# Patient Record
Sex: Female | Born: 1995 | Race: Black or African American | Hispanic: No | Marital: Single | Smoking: Never smoker
Health system: Southern US, Community
[De-identification: ages and names within clinical notes are randomized; demographics above are authoritative.]

## PROBLEM LIST (undated history)

## (undated) DIAGNOSIS — J302 Other seasonal allergic rhinitis: Secondary | ICD-10-CM

## (undated) DIAGNOSIS — F419 Anxiety disorder, unspecified: Secondary | ICD-10-CM

## (undated) DIAGNOSIS — F329 Major depressive disorder, single episode, unspecified: Secondary | ICD-10-CM

## (undated) DIAGNOSIS — F32A Depression, unspecified: Secondary | ICD-10-CM

## (undated) DIAGNOSIS — J45909 Unspecified asthma, uncomplicated: Secondary | ICD-10-CM

---
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
## 2017-01-04 ENCOUNTER — Ambulatory Visit (INDEPENDENT_AMBULATORY_CARE_PROVIDER_SITE_OTHER): Payer: BLUE CROSS/BLUE SHIELD

## 2017-01-04 DIAGNOSIS — J309 Allergic rhinitis, unspecified: Secondary | ICD-10-CM

## 2017-01-04 NOTE — Progress Notes (Signed)
Immunotherapy   Patient Details  Name: Mary Soto MRN: [id number] Date of Birth: 01/02/1996  01/04/2017  Mary Soto here to pick up  blue vial (weeds-d-mite) (grass-tree) Following schedule: A  Frequency:2 times per week Epi-Pen:Epi-Pen Available  Consent signed and patient instructions given. [facility]. Previous records sent to scan center. No problems after 30 minutes in office.    Mary Soto 01/04/2017, 3:54 PM

## 2017-03-26 ENCOUNTER — Ambulatory Visit (INDEPENDENT_AMBULATORY_CARE_PROVIDER_SITE_OTHER): Payer: BLUE CROSS/BLUE SHIELD

## 2017-03-26 DIAGNOSIS — H1010 Acute atopic conjunctivitis, unspecified eye: Secondary | ICD-10-CM

## 2017-03-26 DIAGNOSIS — J309 Allergic rhinitis, unspecified: Secondary | ICD-10-CM | POA: Diagnosis not present

## 2017-04-13 ENCOUNTER — Ambulatory Visit (INDEPENDENT_AMBULATORY_CARE_PROVIDER_SITE_OTHER): Payer: BLUE CROSS/BLUE SHIELD

## 2017-04-13 DIAGNOSIS — J309 Allergic rhinitis, unspecified: Secondary | ICD-10-CM | POA: Diagnosis not present

## 2017-04-17 ENCOUNTER — Ambulatory Visit (INDEPENDENT_AMBULATORY_CARE_PROVIDER_SITE_OTHER): Payer: BLUE CROSS/BLUE SHIELD | Admitting: *Deleted

## 2017-04-17 DIAGNOSIS — J309 Allergic rhinitis, unspecified: Secondary | ICD-10-CM | POA: Diagnosis not present

## 2017-04-27 ENCOUNTER — Ambulatory Visit (INDEPENDENT_AMBULATORY_CARE_PROVIDER_SITE_OTHER): Payer: BLUE CROSS/BLUE SHIELD

## 2017-04-27 DIAGNOSIS — J309 Allergic rhinitis, unspecified: Secondary | ICD-10-CM | POA: Diagnosis not present

## 2017-05-31 ENCOUNTER — Ambulatory Visit (INDEPENDENT_AMBULATORY_CARE_PROVIDER_SITE_OTHER): Payer: BLUE CROSS/BLUE SHIELD | Admitting: *Deleted

## 2017-05-31 DIAGNOSIS — J309 Allergic rhinitis, unspecified: Secondary | ICD-10-CM

## 2017-06-18 ENCOUNTER — Ambulatory Visit (INDEPENDENT_AMBULATORY_CARE_PROVIDER_SITE_OTHER): Payer: BLUE CROSS/BLUE SHIELD

## 2017-06-18 DIAGNOSIS — J309 Allergic rhinitis, unspecified: Secondary | ICD-10-CM

## 2017-06-29 ENCOUNTER — Ambulatory Visit (INDEPENDENT_AMBULATORY_CARE_PROVIDER_SITE_OTHER): Payer: BLUE CROSS/BLUE SHIELD

## 2017-06-29 DIAGNOSIS — J309 Allergic rhinitis, unspecified: Secondary | ICD-10-CM | POA: Diagnosis not present

## 2017-07-19 ENCOUNTER — Ambulatory Visit (INDEPENDENT_AMBULATORY_CARE_PROVIDER_SITE_OTHER): Payer: BLUE CROSS/BLUE SHIELD | Admitting: *Deleted

## 2017-07-19 DIAGNOSIS — J309 Allergic rhinitis, unspecified: Secondary | ICD-10-CM | POA: Diagnosis not present

## 2017-07-31 ENCOUNTER — Ambulatory Visit (INDEPENDENT_AMBULATORY_CARE_PROVIDER_SITE_OTHER): Payer: BLUE CROSS/BLUE SHIELD

## 2017-07-31 DIAGNOSIS — J309 Allergic rhinitis, unspecified: Secondary | ICD-10-CM | POA: Diagnosis not present

## 2017-08-09 ENCOUNTER — Ambulatory Visit (INDEPENDENT_AMBULATORY_CARE_PROVIDER_SITE_OTHER): Payer: BLUE CROSS/BLUE SHIELD

## 2017-08-09 DIAGNOSIS — J309 Allergic rhinitis, unspecified: Secondary | ICD-10-CM

## 2017-08-14 ENCOUNTER — Ambulatory Visit (INDEPENDENT_AMBULATORY_CARE_PROVIDER_SITE_OTHER): Payer: BLUE CROSS/BLUE SHIELD | Admitting: *Deleted

## 2017-08-14 DIAGNOSIS — J309 Allergic rhinitis, unspecified: Secondary | ICD-10-CM | POA: Diagnosis not present

## 2017-08-28 ENCOUNTER — Ambulatory Visit (INDEPENDENT_AMBULATORY_CARE_PROVIDER_SITE_OTHER): Payer: BLUE CROSS/BLUE SHIELD | Admitting: *Deleted

## 2017-08-28 DIAGNOSIS — J309 Allergic rhinitis, unspecified: Secondary | ICD-10-CM | POA: Diagnosis not present

## 2017-10-15 ENCOUNTER — Ambulatory Visit (INDEPENDENT_AMBULATORY_CARE_PROVIDER_SITE_OTHER): Payer: BLUE CROSS/BLUE SHIELD | Admitting: *Deleted

## 2017-10-15 DIAGNOSIS — J309 Allergic rhinitis, unspecified: Secondary | ICD-10-CM

## 2017-10-31 ENCOUNTER — Ambulatory Visit (INDEPENDENT_AMBULATORY_CARE_PROVIDER_SITE_OTHER): Payer: BLUE CROSS/BLUE SHIELD

## 2017-10-31 DIAGNOSIS — J309 Allergic rhinitis, unspecified: Secondary | ICD-10-CM | POA: Diagnosis not present

## 2017-11-08 ENCOUNTER — Ambulatory Visit (INDEPENDENT_AMBULATORY_CARE_PROVIDER_SITE_OTHER): Payer: BLUE CROSS/BLUE SHIELD | Admitting: *Deleted

## 2017-11-08 DIAGNOSIS — J309 Allergic rhinitis, unspecified: Secondary | ICD-10-CM

## 2017-11-16 ENCOUNTER — Ambulatory Visit (INDEPENDENT_AMBULATORY_CARE_PROVIDER_SITE_OTHER): Payer: BLUE CROSS/BLUE SHIELD

## 2017-11-16 DIAGNOSIS — J309 Allergic rhinitis, unspecified: Secondary | ICD-10-CM

## 2017-11-20 ENCOUNTER — Ambulatory Visit (INDEPENDENT_AMBULATORY_CARE_PROVIDER_SITE_OTHER): Payer: BLUE CROSS/BLUE SHIELD | Admitting: *Deleted

## 2017-11-20 ENCOUNTER — Ambulatory Visit: Payer: BLUE CROSS/BLUE SHIELD | Admitting: *Deleted

## 2017-11-20 DIAGNOSIS — J309 Allergic rhinitis, unspecified: Secondary | ICD-10-CM | POA: Diagnosis not present

## 2017-12-04 ENCOUNTER — Ambulatory Visit (INDEPENDENT_AMBULATORY_CARE_PROVIDER_SITE_OTHER): Payer: BLUE CROSS/BLUE SHIELD | Admitting: *Deleted

## 2017-12-04 DIAGNOSIS — J309 Allergic rhinitis, unspecified: Secondary | ICD-10-CM | POA: Diagnosis not present

## 2017-12-11 ENCOUNTER — Ambulatory Visit (INDEPENDENT_AMBULATORY_CARE_PROVIDER_SITE_OTHER): Payer: BLUE CROSS/BLUE SHIELD | Admitting: *Deleted

## 2017-12-11 DIAGNOSIS — J309 Allergic rhinitis, unspecified: Secondary | ICD-10-CM

## 2017-12-18 ENCOUNTER — Ambulatory Visit (INDEPENDENT_AMBULATORY_CARE_PROVIDER_SITE_OTHER): Payer: BLUE CROSS/BLUE SHIELD | Admitting: *Deleted

## 2017-12-18 DIAGNOSIS — J309 Allergic rhinitis, unspecified: Secondary | ICD-10-CM | POA: Diagnosis not present

## 2019-07-28 ENCOUNTER — Other Ambulatory Visit: Payer: Self-pay

## 2019-07-28 ENCOUNTER — Ambulatory Visit (INDEPENDENT_AMBULATORY_CARE_PROVIDER_SITE_OTHER): Payer: BC Managed Care – PPO | Admitting: Podiatry

## 2019-07-28 ENCOUNTER — Ambulatory Visit (INDEPENDENT_AMBULATORY_CARE_PROVIDER_SITE_OTHER): Payer: BC Managed Care – PPO

## 2019-07-28 DIAGNOSIS — M2142 Flat foot [pes planus] (acquired), left foot: Secondary | ICD-10-CM

## 2019-07-28 DIAGNOSIS — M2141 Flat foot [pes planus] (acquired), right foot: Secondary | ICD-10-CM

## 2019-07-28 DIAGNOSIS — M778 Other enthesopathies, not elsewhere classified: Secondary | ICD-10-CM

## 2019-07-28 DIAGNOSIS — M779 Enthesopathy, unspecified: Secondary | ICD-10-CM

## 2019-07-30 NOTE — Progress Notes (Signed)
   Subjective:  23 y.o. female presenting today as a new patient with a chief complaint of pain located on the plantar aspects of bilateral feet that has been present for the past few years. She states the pain in her feet is now affecting her lower back. Excessive walking increases the pain. She has had physical therapy and used crutches in the past for treatment. Patient is here for further evaluation and treatment.   Past Medical History:  Diagnosis Date  . Anxiety and depression   . Asthma   . Seasonal allergies        Objective/Physical Exam General: The patient is alert and oriented x3 in no acute distress.  Dermatology: Skin is warm, dry and supple bilateral lower extremities. Negative for open lesions or macerations.  Vascular: Palpable pedal pulses bilaterally. No edema or erythema noted. Capillary refill within normal limits.  Neurological: Epicritic and protective threshold grossly intact bilaterally.   Musculoskeletal Exam: Range of motion within normal limits to all pedal and ankle joints bilateral. Muscle strength 5/5 in all groups bilateral.  Upon weightbearing there is a medial longitudinal arch collapse bilaterally. Remove foot valgus noted to the bilateral lower extremities with excessive pronation upon mid stance. Pain with palpation noted to the bilateral midfoot.   Radiographic Exam:  Normal osseous mineralization. Joint spaces preserved. No fracture/dislocation/boney destruction.   Pes planus noted on radiographic exam lateral views. Decreased calcaneal inclination and metatarsal declination angle is noted. Anterior break in the cyma line noted on lateral views. Medial talar head to deviation noted on AP radiograph.   Assessment: 1. pes planus bilateral 2. Capsulitis bilateral midfoot   Plan of Care:  1. Patient was evaluated. X-Rays reviewed.  2. Appointment with Liliane Channel, Pedorthist, for custom molded orthotics.  3. Recommended good shoe gear.  4. Return to  clinic as needed.    Edrick Kins, DPM [facility]  Dr. Edrick Kins, [address]                                        [place], [place] [address]                Office [phone]  Fax [phone]

## 2019-08-06 ENCOUNTER — Ambulatory Visit (INDEPENDENT_AMBULATORY_CARE_PROVIDER_SITE_OTHER): Payer: BC Managed Care – PPO | Admitting: Orthotics

## 2019-08-06 ENCOUNTER — Other Ambulatory Visit: Payer: Self-pay

## 2019-08-06 DIAGNOSIS — M2141 Flat foot [pes planus] (acquired), right foot: Secondary | ICD-10-CM

## 2019-08-06 DIAGNOSIS — M778 Other enthesopathies, not elsewhere classified: Secondary | ICD-10-CM

## 2019-08-06 DIAGNOSIS — M2142 Flat foot [pes planus] (acquired), left foot: Secondary | ICD-10-CM

## 2019-08-06 DIAGNOSIS — M779 Enthesopathy, unspecified: Secondary | ICD-10-CM

## 2019-08-06 NOTE — Progress Notes (Signed)
Patient is being seen today for f/o to address congential pes planus/pes planovalgus. Patient is active youth and demonstrates over pronation in gait, prominent medially shifted talus, and collapse of medial column.  Goals are RF stability, longitudinal arch support, decrease in pronation, and ease of discomfort in mobility related activities.   

## 2019-09-02 ENCOUNTER — Ambulatory Visit: Payer: BC Managed Care – PPO | Admitting: Orthotics

## 2019-09-02 ENCOUNTER — Other Ambulatory Visit: Payer: Self-pay

## 2019-09-02 DIAGNOSIS — M778 Other enthesopathies, not elsewhere classified: Secondary | ICD-10-CM

## 2019-09-02 DIAGNOSIS — M2141 Flat foot [pes planus] (acquired), right foot: Secondary | ICD-10-CM

## 2019-09-02 DIAGNOSIS — M2142 Flat foot [pes planus] (acquired), left foot: Secondary | ICD-10-CM

## 2019-09-02 NOTE — Progress Notes (Signed)
Patient came in today to pick up custom made foot orthotics.  The goals were accomplished and the patient reported no dissatisfaction with said orthotics.  Patient was advised of breakin period and how to report any issues. 

## 2019-10-23 ENCOUNTER — Telehealth: Payer: Self-pay | Admitting: Podiatry

## 2019-10-23 DIAGNOSIS — M2141 Flat foot [pes planus] (acquired), right foot: Secondary | ICD-10-CM | POA: Diagnosis not present

## 2019-10-23 DIAGNOSIS — M2142 Flat foot [pes planus] (acquired), left foot: Secondary | ICD-10-CM

## 2019-10-23 DIAGNOSIS — M779 Enthesopathy, unspecified: Secondary | ICD-10-CM | POA: Diagnosis not present

## 2019-10-23 NOTE — Telephone Encounter (Signed)
pts mom called and is wanting to go ahead and order another pair of orthotics for this yr. She would like to get the shorter orthotics not the full length.

## 2021-02-22 NOTE — Progress Notes (Signed)
New Patient Note  RE: Mary Soto MRN: [id number] DOB: December 22, 1995 Date of Office Visit: 02/23/2021  Consult requested by: Mary Sites, MD Primary care provider: Lewis Moccasin, MD  Chief Complaint: Allergy Testing, Nasal Congestion, and Asthma  History of Present Illness: I had the pleasure of seeing Mary Soto for initial evaluation at the [facility] on 02/23/2021. She is a 25 y.o. female, who is self-referred here for the evaluation of allergies.  Patient was last seen by Mary Soto in our office in 2018 for allergic rhino conjunctivitis (was on AIT with weed-dmite & grass-tree) and asthma.   Rhinitis:  She reports symptoms of nasal congestion, rhinorrhea, mucous, coughing, itchy/irritated eyes. Symptoms have been going on for 20+ years. The symptoms are present all year around with worsening in spring. Other triggers include exposure to pollen, dust mites, mold, pet dander. Anosmia: no. Headache: no. She has used allegra, zyrtec, Nasonex with some improvement in symptoms. The nasal sprays tend to cause epistaxis. Sinus infections: no. Previous work up includes: not recently. Patient was on allergy injections for about 4-5 years with some benefit but noticed worsening symptoms since off injections. Tolerated injections with no issues.   Previous ENT evaluation: no. Previous sinus imaging: no. History of nasal polyps: no. Last eye exam: within the past year. History of reflux: yes but improved with change of diet.   Asthma:  ACT score 20.  She reports symptoms of chest tightness, shortness of breath, coughing, wheezing for 15+ years. Current medications include none. She reports not using aerochamber with inhalers. She tried the following inhalers: albuterol, Qvar. Main triggers are allergies,and exercise, pet. In the last month, frequency of symptoms: increased since off antihistamines. Frequency of nocturnal symptoms: 0x/month. Frequency of SABA use:  0x/week. Interference with physical activity: rarely. In the last 12 months, emergency room visits/urgent care visits/doctor office visits or hospitalizations due to respiratory issues: 0. In the last 12 months, oral steroids courses: 0. Lifetime history of hospitalization for respiratory issues: 0. Prior intubations: 0. History of pneumonia: no. Smoking exposure: no. Up to date with flu vaccine: yes. Up to date with COVID-19 vaccine: yes. Prior Covid-19 infection: no.  Patient is an avid runner.  Assessment and Plan: Mary Soto is a 26 y.o. female with: Not well controlled asthma without complication Diagnosed with asthma over 15 years ago.  Triggers include allergies, exertion and pet dander.  Having daily symptoms since off antihistamines.  No recent inhaler use.  Patient is an avid runner.  Act score 20.  Today's spirometry showed severe obstruction with 67% improvement in FEV1 post bronchodilator treatment.  Patient seems to be under perceiver of her symptoms. . Daily controller medication(s): START Airduo Digihaler 232 mcg 1 puff twice a day and rinse mouth after each use. o Sample given. Coupon given. Demonstrated proper use.  . Prior to physical activity: May use albuterol rescue inhaler 2 puffs 5 to 15 minutes prior to strenuous physical activities. Marland Kitchen Rescue medications: May use albuterol rescue inhaler 2 puffs or nebulizer every 4 to 6 hours as needed for shortness of breath, chest tightness, coughing, and wheezing. Monitor frequency of use.   Repeat spirometry at next visit.   Other allergic rhinitis Perennial rhinoconjunctivitis symptoms for 20+ years with worsening in the spring.  Used to be on allergy immunotherapy (weed-dmite & grass-tree) for 4 to 5 years with some benefit but noticed worsening symptoms since off injections.  Unable to skin test today due to uncontrolled asthma -  will get bloodwork instead.   Start environmental control measures as below for pollen avoidance.    Take allegra 180mg  in the morning and take Xyzal 5mg  in the evening. Start dymista (fluticasone + azelastine nasal spray combination) 1 spray per nostril twice a day. If it's not covered let know.   Nasal saline spray (i.e., Simply Saline) or nasal saline lavage (i.e., NeilMed) is recommended as needed and prior to medicated nasal sprays.  May use olopatadine eye drops 0.2% once a day as needed for itchy/watery eyes.  Recommend re-starting allergy immunotherapy once asthma is more stable.   Allergic conjunctivitis of both eyes  See assessment and plan as above for allergic rhinitis.  Adverse reaction to food, subsequent encounter Corn causes itching in the back only and bloating. Trying to limit corn ingestion.  Get bloodwork.  Limit corn intake.   Return in about 4 weeks (around 03/23/2021).  Meds ordered this encounter  Medications  . levocetirizine (XYZAL) 5 MG tablet    Sig: Take 1 tablet (5 mg total) by mouth every evening.    Dispense:  30 tablet    Refill:  5  . Azelastine-Fluticasone 137-50 MCG/ACT SUSP    Sig: Place 1 spray into the nose in the morning and at bedtime.    Dispense:  23 g    Refill:  5  . Olopatadine HCl 0.2 % SOLN    Sig: Apply 1 drop to eye daily as needed (itchy/watery eyes).    Dispense:  2.5 mL    Refill:  5  . PROAIR DIGIHALER 108 (90 Base) MCG/ACT AEPB    Sig: Inhale 2 puffs into the lungs every 4 (four) hours as needed.    Dispense:  1 each    Refill:  1    Please use attached coupon BIN [id number],GRP [place], ID 05/23/2021.  [id number] AIRDUO DIGIHALER 232-14 MCG/ACT AEPB    Sig: Inhale 1 puff into the lungs in the morning and at bedtime. Rinse mouth after each use.    Dispense:  1 each    Refill:  1    Please use attached coupon BIN [id number], GRP [id number], ID Marland Kitchen.    Lab Orders     CBC with Differential/Platelet     Allergens w/Total IgE Area 2     Corn IgE  Other allergy screening: Food allergy: yes  Corn - back itching and  bloating.  Medication allergy: no Hymenoptera allergy: no Urticaria: in the past as a child from pollen exposure.  Eczema:no History of recurrent infections suggestive of immunodeficency: no  Diagnostics: Spirometry:  Tracings reviewed. Her effort: It was hard to get consistent efforts and there is a question as to whether this reflects a maximal maneuver. FVC: 3.47L FEV1: 1.18L, 40% predicted FEV1/FVC ratio: 34% Interpretation: Spirometry consistent with severe obstructive disease with 67% improvement in FEV1 post bronchodilator treatment and clinically feeling improved.  Please see scanned spirometry results for details.  Past Medical History: Patient Active Problem List   Diagnosis Date Noted  . Allergic conjunctivitis of both eyes 02/23/2021  . Adverse reaction to food, subsequent encounter 02/23/2021  . Not well controlled asthma without complication 07/24/2015  . Other allergic rhinitis 07/24/2015   Past Medical History:  Diagnosis Date  . Anxiety and depression   . Asthma   . Seasonal allergies    Past Surgical History: Past Surgical History:  Procedure Laterality Date  . WISDOM TOOTH EXTRACTION  10/2016   Medication List:  Current Outpatient Medications  Medication  Sig Dispense Refill  . AIRDUO DIGIHALER 232-14 MCG/ACT AEPB Inhale 1 puff into the lungs in the morning and at bedtime. Rinse mouth after each use. 1 each 1  . Azelastine-Fluticasone 137-50 MCG/ACT SUSP Place 1 spray into the nose in the morning and at bedtime. 23 g 5  . fexofenadine (ALLEGRA) 180 MG tablet Take 180 mg by mouth at bedtime.    Marland Kitchen ibuprofen (ADVIL,MOTRIN) 200 MG tablet Take 400 mg by mouth every 6 (six) hours as needed for pain.    Marland Kitchen levocetirizine (XYZAL) 5 MG tablet Take 1 tablet (5 mg total) by mouth every evening. 30 tablet 5  . montelukast (SINGULAIR) 10 MG tablet Take 10 mg by mouth daily.    . Olopatadine HCl 0.2 % SOLN Apply 1 drop to eye daily as needed (itchy/watery eyes). 2.5 mL  5  . PROAIR DIGIHALER 108 (90 Base) MCG/ACT AEPB Inhale 2 puffs into the lungs every 4 (four) hours as needed. 1 each 1  . Melatonin 1 MG TABS Take 1 mg by mouth at bedtime. (Patient not taking: Reported on 02/23/2021)     No current facility-administered medications for this visit.   Allergies: Allergies  Allergen Reactions  . Corn-Containing Products Itching   Social History: Social History   Socioeconomic History  . Marital status: Single    Spouse name: Not on file  . Number of children: Not on file  . Years of education: Not on file  . Highest education level: Not on file  Occupational History  . Not on file  Tobacco Use  . Smoking status: Never Smoker  . Smokeless tobacco: Never Used  Vaping Use  . Vaping Use: Never used  Substance and Sexual Activity  . Alcohol use: No  . Drug use: No  . Sexual activity: Not on file  Other Topics Concern  . Not on file  Social History Narrative  . Not on file   Social Determinants of Health   Financial Resource Strain: Not on file  Food Insecurity: Not on file  Transportation Needs: Not on file  Physical Activity: Not on file  Stress: Not on file  Social Connections: Not on file   Lives in a 23 year old home. Smoking: denies Occupation: Media planner HistorySurveyor, minerals in the house: no Engineer, civil (consulting) in the family room: no Carpet in the bedroom: no Heating: electric Cooling: central Pet: yes 3 dogs  Family History: Family History  Problem Relation Age of Onset  . Eczema Mother   . Allergic rhinitis Mother   . Eczema Brother   . Allergic rhinitis Brother   . Asthma Brother   . Angioedema Neg Hx   . Immunodeficiency Neg Hx   . Urticaria Neg Hx    Review of Systems  Constitutional: Negative for appetite change, chills, fever and unexpected weight change.  HENT: Positive for congestion, rhinorrhea and sneezing.   Eyes: Positive for discharge and itching.  Respiratory: Positive for cough, chest  tightness, shortness of breath and wheezing.   Cardiovascular: Negative for chest pain.  Gastrointestinal: Negative for abdominal pain.  Genitourinary: Negative for difficulty urinating.  Skin: Negative for rash.  Allergic/Immunologic: Positive for environmental allergies.  Neurological: Negative for headaches.   Objective: BP 108/72 (BP Location: Right Arm, Patient Position: Sitting, Cuff Size: Normal)   Pulse 91   Temp (!) 97 F (36.1 C) (Temporal)   Resp 18   Ht 5\' 5"  (1.651 m)   Wt 144 lb (65.3 kg)   SpO2  97%   BMI 23.96 kg/m  Body mass index is 23.96 kg/m. Physical Exam Vitals and nursing note reviewed.  Constitutional:      Appearance: Normal appearance. She is well-developed.  HENT:     Head: Normocephalic and atraumatic.     Right Ear: Tympanic membrane and external ear normal.     Left Ear: Tympanic membrane and external ear normal.     Nose: Nose normal.     Mouth/Throat:     Mouth: Mucous membranes are moist.     Pharynx: Oropharynx is clear.  Eyes:     Conjunctiva/sclera: Conjunctivae normal.  Cardiovascular:     Rate and Rhythm: Normal rate and regular rhythm.     Heart sounds: Normal heart sounds. No murmur heard. No friction rub. No gallop.   Pulmonary:     Effort: Pulmonary effort is normal.     Breath sounds: Normal breath sounds. No wheezing, rhonchi or rales.  Musculoskeletal:     Cervical back: Neck supple.  Skin:    General: Skin is warm.     Findings: No rash.  Neurological:     Mental Status: She is alert and oriented to person, place, and time.  Psychiatric:        Behavior: Behavior normal.    The plan was reviewed with the patient/family, and all questions/concerned were addressed.  It was my pleasure to see Mary Soto today and participate in her care. Please feel free to contact me with any questions or concerns.  Sincerely,  Wyline MoodYoon Val Schiavo, DO Allergy & Immunology  [facility] [place] office:  [phone] [facility] office: [phone]

## 2021-02-23 ENCOUNTER — Ambulatory Visit: Payer: BC Managed Care – PPO | Admitting: Allergy

## 2021-02-23 ENCOUNTER — Encounter: Payer: Self-pay | Admitting: Allergy

## 2021-02-23 ENCOUNTER — Other Ambulatory Visit: Payer: Self-pay

## 2021-02-23 ENCOUNTER — Other Ambulatory Visit: Payer: Self-pay | Admitting: Allergy

## 2021-02-23 VITALS — BP 108/72 | HR 91 | Temp 97.0°F | Resp 18 | Ht 65.0 in | Wt 144.0 lb

## 2021-02-23 DIAGNOSIS — J45909 Unspecified asthma, uncomplicated: Secondary | ICD-10-CM

## 2021-02-23 DIAGNOSIS — H1013 Acute atopic conjunctivitis, bilateral: Secondary | ICD-10-CM | POA: Insufficient documentation

## 2021-02-23 DIAGNOSIS — J3089 Other allergic rhinitis: Secondary | ICD-10-CM | POA: Diagnosis not present

## 2021-02-23 DIAGNOSIS — T781XXD Other adverse food reactions, not elsewhere classified, subsequent encounter: Secondary | ICD-10-CM | POA: Diagnosis not present

## 2021-02-23 MED ORDER — AZELASTINE-FLUTICASONE 137-50 MCG/ACT NA SUSP: 1.0000 | Freq: Two times a day (BID) | NASAL | 5 refills | Status: DC

## 2021-02-23 MED ORDER — LEVOCETIRIZINE DIHYDROCHLORIDE 5 MG PO TABS: 5.0000 mg | ORAL_TABLET | Freq: Every evening | ORAL | 5 refills | Status: DC

## 2021-02-23 MED ORDER — AIRDUO DIGIHALER 232-14 MCG/ACT IN AEPB: 1.0000 | INHALATION_SPRAY | Freq: Two times a day (BID) | RESPIRATORY_TRACT | 1 refills | Status: DC

## 2021-02-23 MED ORDER — OLOPATADINE HCL 0.2 % OP SOLN: 1.0000 [drp] | Freq: Every day | OPHTHALMIC | 5 refills | Status: AC | PRN

## 2021-02-23 MED ORDER — PROAIR DIGIHALER 108 (90 BASE) MCG/ACT IN AEPB: 2.0000 | INHALATION_SPRAY | RESPIRATORY_TRACT | 1 refills | Status: DC | PRN

## 2021-02-23 NOTE — Assessment & Plan Note (Signed)
Perennial rhinoconjunctivitis symptoms for 20+ years with worsening in the spring.  Used to be on allergy immunotherapy (weed-dmite & grass-tree) for 4 to 5 years with some benefit but noticed worsening symptoms since off injections.  Unable to skin test today due to uncontrolled asthma - will get bloodwork instead.   Start environmental control measures as below for pollen avoidance.   Take allegra 180mg  in the morning and take Xyzal 5mg  in the evening. Start dymista (fluticasone + azelastine nasal spray combination) 1 spray per nostril twice a day. If it's not covered let know.   Nasal saline spray (i.e., Simply Saline) or nasal saline lavage (i.e., NeilMed) is recommended as needed and prior to medicated nasal sprays.  May use olopatadine eye drops 0.2% once a day as needed for itchy/watery eyes.  Recommend re-starting allergy immunotherapy once asthma is more stable.

## 2021-02-23 NOTE — Assessment & Plan Note (Signed)
   See assessment and plan as above for allergic rhinitis.  

## 2021-02-23 NOTE — Patient Instructions (Addendum)
.   Get bloodwork:  o We are ordering labs, so please allow 1-2 weeks for the results to come back. o With the newly implemented Cures Act, the labs might be visible to you at the same time that they become visible to me. However, I will not address the results until all of the results are back, so please be patient.  o In the meantime, continue recommendations in your patient instructions, including avoidance measures (if applicable), until you hear from me.  Asthma:  . Daily controller medication(s): START Airduo Digihaler 232 mcg 1 puff twice a day and rinse mouth after each use. o Sample given. Coupon given. Demonstrated proper use.  . Prior to physical activity: May use albuterol rescue inhaler 2 puffs 5 to 15 minutes prior to strenuous physical activities. Marland Kitchen Rescue medications: May use albuterol rescue inhaler 2 puffs or nebulizer every 4 to 6 hours as needed for shortness of breath, chest tightness, coughing, and wheezing. Monitor frequency of use.  . Asthma control goals:  o Full participation in all desired activities (may need albuterol before activity) o Albuterol use two times or less a week on average (not counting use with activity) o Cough interfering with sleep two times or less a month o Oral steroids no more than once a year o No hospitalizations  Environmental allergies  Start environmental control measures as below.  Take allegra 180mg  in the morning and take Xyzal 5mg  in the evening. Start dymista (fluticasone + azelastine nasal spray combination) 1 spray per nostril twice a day. If it's not covered let know.   Nasal saline spray (i.e., Simply Saline) or nasal saline lavage (i.e., NeilMed) is recommended as needed and prior to medicated nasal sprays.  May use olopatadine eye drops 0.2% once a day as needed for itchy/watery eyes.  Follow up in 1 months or sooner if needed.   Reducing Pollen Exposure . Pollen seasons: trees (spring), grass (summer) and ragweed/weeds  (fall). 11-18-1982 Keep windows closed in your home and car to lower pollen exposure.  03-22-1981 air conditioning in the bedroom and throughout the house if possible.  . Avoid going out in dry windy days - especially early morning. . Pollen counts are highest between 5 - 10 AM and on dry, hot and windy days.  . Save outside activities for late afternoon or after a heavy rain, when pollen levels are lower.  . Avoid mowing of grass if you have grass pollen allergy. Marland Kitchen Be aware that pollen can also be transported indoors on people and pets.  . Dry your clothes in an automatic dryer rather than hanging them outside where they might collect pollen.  . Rinse hair and eyes before bedtime.

## 2021-02-23 NOTE — Assessment & Plan Note (Signed)
Corn causes itching in the back only and bloating. Trying to limit corn ingestion.  Get bloodwork.  Limit corn intake.

## 2021-02-23 NOTE — Assessment & Plan Note (Signed)
Diagnosed with asthma over 15 years ago.  Triggers include allergies, exertion and pet dander.  Having daily symptoms since off antihistamines.  No recent inhaler use.  Patient is an avid runner.  Act score 20.  Today's spirometry showed severe obstruction with 67% improvement in FEV1 post bronchodilator treatment.  Patient seems to be under perceiver of her symptoms. . Daily controller medication(s): START Airduo Digihaler 232 mcg 1 puff twice a day and rinse mouth after each use. o Sample given. Coupon given. Demonstrated proper use.  . Prior to physical activity: May use albuterol rescue inhaler 2 puffs 5 to 15 minutes prior to strenuous physical activities. Marland Kitchen Rescue medications: May use albuterol rescue inhaler 2 puffs or nebulizer every 4 to 6 hours as needed for shortness of breath, chest tightness, coughing, and wheezing. Monitor frequency of use.   Repeat spirometry at next visit.

## 2021-02-28 LAB — ALLERGENS W/TOTAL IGE AREA 2
Alternaria Alternata IgE: 0.13 kU/L — AB
Aspergillus Fumigatus IgE: <0.1 kU/L
Bermuda Grass IgE: 4.92 kU/L — AB
Cat Dander IgE: 0.33 kU/L — AB
Cedar, Mountain IgE: 1.16 kU/L — AB
Cladosporium Herbarum IgE: <0.1 kU/L
Cockroach, German IgE: <0.1 kU/L
Common Silver Birch IgE: 13.2 kU/L — AB
Cottonwood IgE: 1.39 kU/L — AB
D Farinae IgE: <0.1 kU/L
D Pteronyssinus IgE: <0.1 kU/L
Dog Dander IgE: 0.15 kU/L — AB
Elm, American IgE: 6.95 kU/L — AB
IgE (Immunoglobulin E), Serum: 214 IU/mL (ref 6–495)
Johnson Grass IgE: 8.53 kU/L — AB
Maple/Box Elder IgE: 2.9 kU/L — AB
Mouse Urine IgE: <0.1 kU/L
Oak, White IgE: 18.4 kU/L — AB
Pecan, Hickory IgE: 11 kU/L — AB
Penicillium Chrysogen IgE: <0.1 kU/L
Pigweed, Rough IgE: 2.08 kU/L — AB
Ragweed, Short IgE: 3.91 kU/L — AB
Sheep Sorrel IgE Qn: 4.22 kU/L — AB
Timothy Grass IgE: 32.2 kU/L — AB
White Mulberry IgE: 3.75 kU/L — AB

## 2021-02-28 LAB — CBC WITH DIFFERENTIAL/PLATELET
Basophils Absolute: 0.1 10*3/uL (ref 0.0–0.2)
Basos: 1 %
EOS (ABSOLUTE): 0.6 10*3/uL — ABNORMAL HIGH (ref 0.0–0.4)
Eos: 9 %
Hematocrit: 41.5 % (ref 34.0–46.6)
Hemoglobin: 13 g/dL (ref 11.1–15.9)
Immature Grans (Abs): 0 10*3/uL (ref 0.0–0.1)
Immature Granulocytes: 1 %
Lymphocytes Absolute: 1.4 10*3/uL (ref 0.7–3.1)
Lymphs: 21 %
MCH: 28.2 pg (ref 26.6–33.0)
MCHC: 31.3 g/dL — ABNORMAL LOW (ref 31.5–35.7)
MCV: 90 fL (ref 79–97)
Monocytes Absolute: 0.4 10*3/uL (ref 0.1–0.9)
Monocytes: 7 %
Neutrophils Absolute: 4 10*3/uL (ref 1.4–7.0)
Neutrophils: 61 %
Platelets: 271 10*3/uL (ref 150–450)
RBC: 4.61 x10E6/uL (ref 3.77–5.28)
RDW: 12.8 % (ref 11.7–15.4)
WBC: 6.5 10*3/uL (ref 3.4–10.8)

## 2021-03-01 LAB — ALLERGEN, CORN F8: Allergen Corn, IgE: 1.63 kU/L — AB

## 2021-03-04 ENCOUNTER — Telehealth: Payer: Self-pay | Admitting: *Deleted

## 2021-03-04 NOTE — Telephone Encounter (Signed)
-----   Message from Ellamae Sia, DO sent at 03/01/2021  8:25 AM EDT ----- Please call patient.   Bloodwork positive to corn - start to avoid corn completely. Environmental allergy panel was positive to cat, grass pollen, tree pollen, ragweed pollen, weed pollen.  Borderline to dog.  Continue all inhalers and allergy medications as discussed at the last visit.  Based on your blood work you would also qualify for biologics injections for asthma.  Keep follow up in May to discuss further.

## 2021-03-28 ENCOUNTER — Other Ambulatory Visit: Payer: Self-pay

## 2021-03-28 ENCOUNTER — Encounter: Payer: Self-pay | Admitting: Allergy

## 2021-03-28 ENCOUNTER — Ambulatory Visit: Payer: BC Managed Care – PPO | Admitting: Allergy

## 2021-03-28 VITALS — BP 100/68 | HR 74 | Temp 97.8°F | Resp 18 | Ht 65.0 in | Wt 146.8 lb

## 2021-03-28 DIAGNOSIS — J3089 Other allergic rhinitis: Secondary | ICD-10-CM

## 2021-03-28 DIAGNOSIS — H1013 Acute atopic conjunctivitis, bilateral: Secondary | ICD-10-CM

## 2021-03-28 DIAGNOSIS — H1010 Acute atopic conjunctivitis, unspecified eye: Secondary | ICD-10-CM | POA: Insufficient documentation

## 2021-03-28 DIAGNOSIS — J302 Other seasonal allergic rhinitis: Secondary | ICD-10-CM | POA: Diagnosis not present

## 2021-03-28 DIAGNOSIS — J45909 Unspecified asthma, uncomplicated: Secondary | ICD-10-CM

## 2021-03-28 DIAGNOSIS — T781XXD Other adverse food reactions, not elsewhere classified, subsequent encounter: Secondary | ICD-10-CM

## 2021-03-28 MED ORDER — SPIRIVA RESPIMAT 1.25 MCG/ACT IN AERS: 2.0000 | INHALATION_SPRAY | Freq: Every day | RESPIRATORY_TRACT | 5 refills | Status: DC

## 2021-03-28 NOTE — Assessment & Plan Note (Signed)
Past history - Perennial rhinoconjunctivitis symptoms for 20+ years with worsening in the spring.  Used to be on allergy immunotherapy (weed-dmite & grass-tree) for 4 to 5 years with some benefit but noticed worsening symptoms since off injections. Interim history - stable. 2022 bloodwork positive to cat, grass pollen, tree pollen, ragweed pollen, weed pollen. Borderline to dog.  Continue environmental control measures as below.  Xyzal (levocetirizine) 5mg  in the evening. Continue dymista (fluticasone + azelastine nasal spray combination) 1 spray per nostril twice a day.   Nasal saline spray (i.e., Simply Saline) or nasal saline lavage (i.e., NeilMed) is recommended as needed and prior to medicated nasal sprays.  May use olopatadine eye drops 0.2% once a day as needed for itchy/watery eyes.  Recommend re-starting allergy immunotherapy once asthma is more stable.

## 2021-03-28 NOTE — Assessment & Plan Note (Signed)
Past history - Corn causes itching in the back only and bloating.  Interim history  - 2022 bloodwork positive to corn IgE.  Continue to avoid corn.  For mild symptoms you can take over the counter antihistamines such as Benadryl and monitor symptoms closely. If symptoms worsen or if you have severe symptoms including breathing issues, throat closure, significant swelling, whole body hives, severe diarrhea and vomiting, lightheadedness then seek immediate medical care.

## 2021-03-28 NOTE — Assessment & Plan Note (Signed)
Past history - Diagnosed with asthma over 15 years ago.  Triggers include allergies, exertion and pet dander.  Patient is an avid runner. 2022 spirometry showed severe obstruction with 67% improvement in FEV1 post bronchodilator treatment.  Interim history - doing better with Airduo and premedication with albuterol. Still running quite frequently.   ACT score 22.  Today's spirometry showed: moderate obstructive disease with 75% improvement in FEV1 post bronchodilator treatment. Clinically feeling unchanged. Patient seems to be an underperceiver of her symptoms.  . Read about biologics for asthma.  Marland Kitchen START Spiriva 1.29mcg 2 puffs once a day. Sample given and demonstrated proper use.  . Daily controller medication(s): continue Airduo Digihaler 232 mcg 1 puff twice a day and rinse mouth after each use. . Prior to physical activity: May use albuterol rescue inhaler 2 puffs 5 to 15 minutes prior to strenuous physical activities. Marland Kitchen Rescue medications: May use albuterol rescue inhaler 2 puffs or nebulizer every 4 to 6 hours as needed for shortness of breath, chest tightness, coughing, and wheezing. Monitor frequency of use.  . Get spirometry at next visit.

## 2021-03-28 NOTE — Patient Instructions (Addendum)
Asthma:  . Read about biologics for asthma.  Marland Kitchen START Spiriva 1.20mcg 2 puffs once a day. Sample given and demonstrated proper use.  . Daily controller medication(s): continue Airduo Digihaler 232 mcg 1 puff twice a day and rinse mouth after each use. . Prior to physical activity: May use albuterol rescue inhaler 2 puffs 5 to 15 minutes prior to strenuous physical activities. Marland Kitchen Rescue medications: May use albuterol rescue inhaler 2 puffs or nebulizer every 4 to 6 hours as needed for shortness of breath, chest tightness, coughing, and wheezing. Monitor frequency of use.  . Asthma control goals:  o Full participation in all desired activities (may need albuterol before activity) o Albuterol use two times or less a week on average (not counting use with activity) o Cough interfering with sleep two times or less a month o Oral steroids no more than once a year o No hospitalizations  Environmental allergies  Bloodwork positive to cat, grass pollen, tree pollen, ragweed pollen, weed pollen. Borderline to dog.  Continue environmental control measures as below.  Xyzal (levocetirizine) 5mg  in the evening. Continue dymista (fluticasone + azelastine nasal spray combination) 1 spray per nostril twice a day. If it's not covered let know.   Nasal saline spray (i.e., Simply Saline) or nasal saline lavage (i.e., NeilMed) is recommended as needed and prior to medicated nasal sprays.  May use olopatadine eye drops 0.2% once a day as needed for itchy/watery eyes.  Food allergy  Continue to avoid corn.  Follow up in 6 weeks or sooner if needed.   Reducing Pollen Exposure . Pollen seasons: trees (spring), grass (summer) and ragweed/weeds (fall). 03-22-1981 Keep windows closed in your home and car to lower pollen exposure.  Marland Kitchen air conditioning in the bedroom and throughout the house if possible.  . Avoid going out in dry windy days - especially early morning. . Pollen counts are highest between 5 -  10 AM and on dry, hot and windy days.  . Save outside activities for late afternoon or after a heavy rain, when pollen levels are lower.  . Avoid mowing of grass if you have grass pollen allergy. Lilian Kapur Be aware that pollen can also be transported indoors on people and pets.  . Dry your clothes in an automatic dryer rather than hanging them outside where they might collect pollen.  . Rinse hair and eyes before bedtime.

## 2021-03-28 NOTE — Progress Notes (Signed)
Follow Up Note  RE: Mary Soto MRN: [id number] DOB: 1996/10/08 Date of Office Visit: 03/28/2021  Referring provider: Lewis Moccasin, MD Primary care provider: Lewis Moccasin, MD  Chief Complaint: Asthma  History of Present Illness: I had the pleasure of seeing Mary Soto for a follow up visit at the [facility] on 03/28/2021. She is a 25 y.o. female, who is being followed for asthma, allergic rhinoconjunctivitis and adverse food reaction. Her previous allergy office visit was on 02/23/2021 with Dr. Selena Batten. Today is a regular follow up visit.  Asthma:  ACT score 22.  Currently on Airduo Dighilar 1 puff BID and using albuterol 1puff prior to exertion with good benefit. Denies any SOB, coughing, wheezing, chest tightness, nocturnal awakenings, ER/urgent care visits or prednisone use since the last visit.   Patient was able to run a race (10 miles within 2 hours) in May without any issues with her breathing.   Runs a few times per week for 5-8 miles at a time - able to run better with the Airduo.  Allergic rhino conjunctivitis  Currently taking levocetirizine 5mg  in the morning, dymista 1 spray per nostril once a day. No nosebleeds.  Using eye drops as needed.   Adverse reaction to food Avoiding corn and corn syrup with some benefit.   Assessment and Plan: Mary Soto is a 25 y.o. female with: Not well controlled asthma without complication Past history - Diagnosed with asthma over 15 years ago.  Triggers include allergies, exertion and pet dander.  Patient is an avid runner. 2022 spirometry showed severe obstruction with 67% improvement in FEV1 post bronchodilator treatment.  Interim history - doing better with Airduo and premedication with albuterol. Still running quite frequently.   ACT score 22.  Today's spirometry showed: moderate obstructive disease with 75% improvement in FEV1 post bronchodilator treatment. Clinically feeling unchanged. Patient  seems to be an underperceiver of her symptoms.  . Read about biologics for asthma.  2023 START Spiriva 1.58mcg 2 puffs once a day. Sample given and demonstrated proper use.  . Daily controller medication(s): continue Airduo Digihaler 232 mcg 1 puff twice a day and rinse mouth after each use. . Prior to physical activity: May use albuterol rescue inhaler 2 puffs 5 to 15 minutes prior to strenuous physical activities. 32m Rescue medications: May use albuterol rescue inhaler 2 puffs or nebulizer every 4 to 6 hours as needed for shortness of breath, chest tightness, coughing, and wheezing. Monitor frequency of use.  . Get spirometry at next visit.  Seasonal and perennial allergic rhinoconjunctivitis Past history - Perennial rhinoconjunctivitis symptoms for 20+ years with worsening in the spring.  Used to be on allergy immunotherapy (weed-dmite & grass-tree) for 4 to 5 years with some benefit but noticed worsening symptoms since off injections. Interim history - stable. 2022 bloodwork positive to cat, grass pollen, tree pollen, ragweed pollen, weed pollen. Borderline to dog.  Continue environmental control measures as below.  Xyzal (levocetirizine) 5mg  in the evening. Continue dymista (fluticasone + azelastine nasal spray combination) 1 spray per nostril twice a day.   Nasal saline spray (i.e., Simply Saline) or nasal saline lavage (i.e., NeilMed) is recommended as needed and prior to medicated nasal sprays.  May use olopatadine eye drops 0.2% once a day as needed for itchy/watery eyes.  Recommend re-starting allergy immunotherapy once asthma is more stable.   Adverse reaction to food, subsequent encounter Past history - Corn causes itching in the back only and bloating.  Interim history  - 2022 bloodwork positive to corn IgE.  Continue to avoid corn.  For mild symptoms you can take over the counter antihistamines such as Benadryl and monitor symptoms closely. If symptoms worsen or if you have  severe symptoms including breathing issues, throat closure, significant swelling, whole body hives, severe diarrhea and vomiting, lightheadedness then seek immediate medical care.  Return in about 6 weeks (around 05/09/2021).  Meds ordered this encounter  Medications  . Tiotropium Bromide Monohydrate (SPIRIVA RESPIMAT) 1.25 MCG/ACT AERS    Sig: Inhale 2 puffs into the lungs daily.    Dispense:  4 g    Refill:  5   Lab Orders  No laboratory test(s) ordered today    Diagnostics: Spirometry:  Tracings reviewed. Her effort: Good reproducible efforts. FVC: 3.43L FEV1: 1.54L, 52% predicted FEV1/FVC ratio: 45% Interpretation: Spirometry consistent with moderate obstructive disease with 75% improvement in FEV1 post bronchodilator treatment. Clinically feeling unchanged.   Please see scanned spirometry results for details.  Medication List:  Current Outpatient Medications  Medication Sig Dispense Refill  . AIRDUO DIGIHALER 232-14 MCG/ACT AEPB Inhale 1 puff into the lungs in the morning and at bedtime. Rinse mouth after each use. 1 each 1  . Azelastine-Fluticasone 137-50 MCG/ACT SUSP Place 1 spray into the nose in the morning and at bedtime. 23 g 5  . DULoxetine (CYMBALTA) 60 MG capsule Take 60 mg by mouth daily.    . fexofenadine (ALLEGRA) 180 MG tablet Take 180 mg by mouth at bedtime.    Marland Kitchen ibuprofen (ADVIL,MOTRIN) 200 MG tablet Take 400 mg by mouth every 6 (six) hours as needed for pain.    Marland Kitchen levocetirizine (XYZAL) 5 MG tablet Take 1 tablet (5 mg total) by mouth every evening. 30 tablet 5  . Levonorgestrel (SKYLA) 13.5 MG IUD Skyla 14 mcg/24 hrs (3 yrs) 13.5 mg intrauterine device  Take 1 device by intrauterine route.    . Olopatadine HCl 0.2 % SOLN Apply 1 drop to eye daily as needed (itchy/watery eyes). 2.5 mL 5  . PROAIR DIGIHALER 108 (90 Base) MCG/ACT AEPB Inhale 2 puffs into the lungs every 4 (four) hours as needed. 1 each 1  . Tiotropium Bromide Monohydrate (SPIRIVA RESPIMAT) 1.25  MCG/ACT AERS Inhale 2 puffs into the lungs daily. 4 g 5   No current facility-administered medications for this visit.   Allergies: Allergies  Allergen Reactions  . Corn-Containing Products Itching   I reviewed her past medical history, social history, family history, and environmental history and no significant changes have been reported from her previous visit.  Review of Systems  Constitutional: Negative for appetite change, chills, fever and unexpected weight change.  HENT: Negative for congestion, rhinorrhea and sneezing.   Eyes: Negative for discharge and itching.  Respiratory: Negative for cough, chest tightness, shortness of breath and wheezing.   Cardiovascular: Negative for chest pain.  Gastrointestinal: Negative for abdominal pain.  Genitourinary: Negative for difficulty urinating.  Skin: Negative for rash.  Allergic/Immunologic: Positive for environmental allergies and food allergies.  Neurological: Negative for headaches.   Objective: BP 100/68 (BP Location: Left Arm, Patient Position: Sitting, Cuff Size: Normal)   Pulse 74   Temp 97.8 F (36.6 C) (Temporal)   Resp 18   Ht 5\' 5"  (1.651 m)   Wt 146 lb 12.8 oz (66.6 kg)   SpO2 97%   BMI 24.43 kg/m  Body mass index is 24.43 kg/m. Physical Exam Vitals and nursing note reviewed.  Constitutional:  Appearance: Normal appearance. She is well-developed.  HENT:     Head: Normocephalic and atraumatic.     Right Ear: Tympanic membrane and external ear normal.     Left Ear: Tympanic membrane and external ear normal.     Nose: Nose normal.     Mouth/Throat:     Mouth: Mucous membranes are moist.     Pharynx: Oropharynx is clear.  Eyes:     Conjunctiva/sclera: Conjunctivae normal.  Cardiovascular:     Rate and Rhythm: Normal rate and regular rhythm.     Heart sounds: Normal heart sounds. No murmur heard. No friction rub. No gallop.   Pulmonary:     Effort: Pulmonary effort is normal.     Breath sounds: Normal  breath sounds. No wheezing, rhonchi or rales.  Musculoskeletal:     Cervical back: Neck supple.  Skin:    General: Skin is warm.     Findings: No rash.  Neurological:     Mental Status: She is alert and oriented to person, place, and time.  Psychiatric:        Behavior: Behavior normal.    Previous notes and tests were reviewed. The plan was reviewed with the patient/family, and all questions/concerned were addressed.  It was my pleasure to see Mary Soto today and participate in her care. Please feel free to contact me with any questions or concerns.  Sincerely,  Wyline Mood, DO Allergy & Immunology  [facility] office: [phone] [facility] office: [phone]

## 2021-05-09 ENCOUNTER — Ambulatory Visit: Payer: BC Managed Care – PPO | Admitting: Family

## 2021-05-28 NOTE — Patient Instructions (Addendum)
Asthma Continue Spiriva 1.32mcg 2 puffs once a day. Daily controller medication(s): continue Airduo Digihaler 232 mcg 1 puff twice a day and rinse mouth after each use. Prior to physical activity: May use albuterol rescue inhaler 2 puffs 5 to 15 minutes prior to strenuous physical activities. Rescue medications: May use albuterol rescue inhaler 2 puffs or nebulizer every 4 to 6 hours as needed for shortness of breath, chest tightness, coughing, and wheezing. Monitor frequency of use.  Asthma control goals:  Full participation in all desired activities (may need albuterol before activity) Albuterol use two times or less a week on average (not counting use with activity) Cough interfering with sleep two times or less a month Oral steroids no more than once a year No hospitalizations  Seasonal and perennial allergic rhinoconjunctivitis Bloodwork positive to cat, grass pollen, tree pollen, ragweed pollen, weed pollen.  Borderline to dog. Continue environmental control measures  Xyzal (levocetirizine) 5mg  in the evening. Continue dymista (fluticasone + azelastine nasal spray combination) 1 spray per nostril twice a day. If it's not covered let know.  Nasal saline spray (i.e., Simply Saline) or nasal saline lavage (i.e., NeilMed) is recommended as needed and prior to medicated nasal sprays. May use olopatadine eye drops 0.2% once a day as needed for itchy/watery eyes. Schedule an appointment to start allergy injections in 2-3 weeks. Consent signed. AuviQ 0.3 mg sent in.  Adverse food reaction Continue to avoid corn. For mild symptoms you can take over the counter antihistamines such as Benadryl and monitor symptoms closely. If symptoms worsen or if you have severe symptoms including breathing issues, throat closure, significant swelling, whole body hives, severe diarrhea and vomiting, lightheadedness then seek immediate medical care.  Follow up in 2 months or sooner if needed. Also, schedule an  appointment in 2-3 weeks to start allergy injections

## 2021-05-30 ENCOUNTER — Encounter: Payer: Self-pay | Admitting: Family

## 2021-05-30 ENCOUNTER — Other Ambulatory Visit: Payer: Self-pay

## 2021-05-30 ENCOUNTER — Ambulatory Visit (INDEPENDENT_AMBULATORY_CARE_PROVIDER_SITE_OTHER): Payer: BC Managed Care – PPO | Admitting: Family

## 2021-05-30 VITALS — BP 116/76 | HR 93 | Temp 98.3°F | Resp 16 | Ht 65.0 in | Wt 143.8 lb

## 2021-05-30 DIAGNOSIS — J4540 Moderate persistent asthma, uncomplicated: Secondary | ICD-10-CM | POA: Diagnosis not present

## 2021-05-30 DIAGNOSIS — J3089 Other allergic rhinitis: Secondary | ICD-10-CM | POA: Diagnosis not present

## 2021-05-30 DIAGNOSIS — H1013 Acute atopic conjunctivitis, bilateral: Secondary | ICD-10-CM

## 2021-05-30 DIAGNOSIS — T781XXD Other adverse food reactions, not elsewhere classified, subsequent encounter: Secondary | ICD-10-CM

## 2021-05-30 DIAGNOSIS — J302 Other seasonal allergic rhinitis: Secondary | ICD-10-CM | POA: Diagnosis not present

## 2021-05-30 DIAGNOSIS — H1010 Acute atopic conjunctivitis, unspecified eye: Secondary | ICD-10-CM

## 2021-05-30 MED ORDER — EPINEPHRINE 0.3 MG/0.3ML IJ SOAJ: 0.3000 mg | INTRAMUSCULAR | 1 refills | Status: AC | PRN

## 2021-05-30 NOTE — Progress Notes (Signed)
[address] Mary Soto [place] [address] Dept: [phone]  FOLLOW UP NOTE  Patient ID: Mary Soto, female    DOB: June 20, 1996  Age: 25 y.o. MRN: [id number] Date of Office Visit: 05/30/2021  Assessment  Chief Complaint: Asthma (ACT -25 )  HPI Mary Soto is a 25 year old female who presents today for follow-up of not well controlled asthma without complication, seasonal and perennial allergic rhinoconjunctivitis, and adverse reaction to food.  She was last seen on Mar 28, 2021 by Dr. Selena Batten.  Not well controlled asthma is reported as doing better with AirDuo 232 mcg 1 puff twice a day, Spiriva Respimat 1.25 mcg 2 puffs once a day, and albuterol as needed.  She denies coughing, wheezing, tightness in chest, shortness of breath, and nocturnal awakenings due to breathing problems.  She reports that she was able to run a 5K on the 10th of this month and she did really well as far as breathing.  Since her last office visit she has not made any trips to the emergency room or urgent care due to breathing problems.  She has also not required any systemic steroids.  She has not had to use her albuterol inhaler in the past 3 months.  Seasonal and perennial allergic rhinoconjunctivitis is ordered as controlled right now with Xyzal 5 mg at night and she also has a nasal spray that she does not know the name of.  She reports that her insurance did not cover Dymista nasal spray.It was going to cost her $60.00.  Instructed her to call our office with the name of the nasal spray.  She denies any rhinorrhea, nasal congestion, postnasal drip, and itchy watery eyes.  She has not had any sinus infections since we last saw her.  She is interested in starting allergy injections.  She continues to avoid corn and corn syrup without any accidental ingestion. She reports that her epinephrine autoinjector device is expired.   Drug Allergies:  Allergies  Allergen Reactions   Corn Oil Itching   Corn-Containing  Products Itching    Review of Systems: Review of Systems  Constitutional:  Negative for chills and fever.  HENT:         Denies rhinorrhea, nasal congestion, and post nasal drip  Eyes:        Denies itchy watery eyes  Respiratory:  Negative for cough, shortness of breath and wheezing.   Cardiovascular:  Negative for chest pain and palpitations.  Gastrointestinal:        Denies heartburn and reflux symptoms  Genitourinary:  Negative for dysuria.  Skin:        Reports dry skin  Neurological:  Negative for headaches.  Endo/Heme/Allergies:  Positive for environmental allergies.    Physical Exam: BP 116/76   Pulse 93   Temp 98.3 F (36.8 C)   Resp 16   Ht 5\' 5"  (1.651 m)   Wt 143 lb 12.8 oz (65.2 kg)   SpO2 98%   BMI 23.93 kg/m    Physical Exam Constitutional:      Appearance: Normal appearance.  HENT:     Head: Normocephalic and atraumatic.     Comments: Pharynx normal. Eyes normal. Ears normal. Nose: Bilateral lower turbinates moderately edematous and slightly erythematous with clear drainage noted    Right Ear: Tympanic membrane, ear canal and external ear normal.     Left Ear: Tympanic membrane, ear canal and external ear normal.     Mouth/Throat:     Mouth: Mucous membranes are  moist.     Pharynx: Oropharynx is clear.  Eyes:     Conjunctiva/sclera: Conjunctivae normal.  Cardiovascular:     Rate and Rhythm: Regular rhythm.  Pulmonary:     Effort: Pulmonary effort is normal.     Breath sounds: Normal breath sounds.     Comments: Lungs clear to auscultation Musculoskeletal:     Cervical back: Neck supple.  Skin:    General: Skin is warm.  Neurological:     Mental Status: She is alert and oriented to person, place, and time.  Psychiatric:        Mood and Affect: Mood normal.        Behavior: Behavior normal.        Thought Content: Thought content normal.        Judgment: Judgment normal.    Diagnostics: FVC 3.45 L, FEV1 2.21 L.  Predicted FVC 3.52 L,  predicted FEV1 3.06 L.  Spirometry indicates mild airway obstruction.  Post bronchodilator response shows FVC 3.23 L, FEV1 2.14 L.  Spirometry indicates mild airway obstruction with no change in FEV1.  Assessment and Plan: 1. Moderate persistent asthma without complication   2. Seasonal and perennial allergic rhinoconjunctivitis   3. Adverse reaction to food, subsequent encounter   4. Allergic conjunctivitis of both eyes     No orders of the defined types were placed in this encounter.   Patient Instructions  Asthma Continue Spiriva 1.59mcg 2 puffs once a day. Daily controller medication(s): continue Airduo Digihaler 232 mcg 1 puff twice a day and rinse mouth after each use. Prior to physical activity: May use albuterol rescue inhaler 2 puffs 5 to 15 minutes prior to strenuous physical activities. Rescue medications: May use albuterol rescue inhaler 2 puffs or nebulizer every 4 to 6 hours as needed for shortness of breath, chest tightness, coughing, and wheezing. Monitor frequency of use.  Asthma control goals:  Full participation in all desired activities (may need albuterol before activity) Albuterol use two times or less a week on average (not counting use with activity) Cough interfering with sleep two times or less a month Oral steroids no more than once a year No hospitalizations  Seasonal and perennial allergic rhinoconjunctivitis Bloodwork positive to cat, grass pollen, tree pollen, ragweed pollen, weed pollen.  Borderline to dog. Continue environmental control measures  Xyzal (levocetirizine) 5mg  in the evening. Continue dymista (fluticasone + azelastine nasal spray combination) 1 spray per nostril twice a day. If it's not covered let know.  Nasal saline spray (i.e., Simply Saline) or nasal saline lavage (i.e., NeilMed) is recommended as needed and prior to medicated nasal sprays. May use olopatadine eye drops 0.2% once a day as needed for itchy/watery eyes. Schedule an  appointment to start allergy injections in 2-3 weeks. Consent signed. AuviQ 0.3 mg sent in.  Adverse food reaction Continue to avoid corn. For mild symptoms you can take over the counter antihistamines such as Benadryl and monitor symptoms closely. If symptoms worsen or if you have severe symptoms including breathing issues, throat closure, significant swelling, whole body hives, severe diarrhea and vomiting, lightheadedness then seek immediate medical care.  Follow up in 2 months or sooner if needed. Also, schedule an appointment in 2-3 weeks to start allergy injections   Return in about 3 months (around 08/30/2021), or if symptoms worsen or fail to improve, for also schedule an appointment in 2-3 weeks to start allergy injections.    Thank you for the opportunity to care for this patient.  Please do not hesitate to contact me with questions.  Nehemiah Settle, FNP [facility]

## 2021-06-06 ENCOUNTER — Telehealth: Payer: Self-pay | Admitting: *Deleted

## 2021-06-06 NOTE — Telephone Encounter (Signed)
Error

## 2021-06-07 ENCOUNTER — Telehealth: Payer: Self-pay | Admitting: *Deleted

## 2021-06-07 NOTE — Telephone Encounter (Signed)
Called and left a voicemail asking for the patient to return call to discuss.  

## 2021-06-07 NOTE — Telephone Encounter (Signed)
-----   Message from Ma Hillock, [place] sent at 06/06/2021  1:27 PM EDT ----- Called patient and she briefly stated that now was not a good time to talk and that she would call back shortly to discuss.  ----- Message ----- From: Ellamae Sia, DO Sent: 06/06/2021   8:38 AM EDT To: [facility]  Please call patient and see if she wants to start injections. If yes, please schedule for first injection appointment.  ----- Message ----- From: Nehemiah Settle, FNP Sent: 06/05/2021   6:42 AM EDT To: Ellamae Sia, DO  I was under the impression she was going to make it when she left. ----- Message ----- From: Ellamae Sia, DO Sent: 05/30/2021  10:00 PM EDT To: Nehemiah Settle, FNP  It doesn't look like she made the 3 week allergy shot appointment. Did she say she was going to call back to make it?   ----- Message ----- From: Nehemiah Settle, FNP Sent: 05/30/2021  11:49 AM EDT To: Ellamae Sia, DO  Please write vaccine script to start allergy injections. Consent signed.

## 2021-06-23 ENCOUNTER — Other Ambulatory Visit: Payer: Self-pay | Admitting: Allergy

## 2021-06-23 DIAGNOSIS — J3089 Other allergic rhinitis: Secondary | ICD-10-CM

## 2021-06-28 DIAGNOSIS — J301 Allergic rhinitis due to pollen: Secondary | ICD-10-CM | POA: Diagnosis not present

## 2021-06-28 NOTE — Progress Notes (Signed)
Aeroallergen Immunotherapy   Ordering Provider: Dr. Wyline Mood   Patient Details  Name: Mary Soto  MRN: [id number]  Date of Birth: [birth year]   Order 1 of 2   Vial Label: G-Rw-W-T   0.3 ml (Volume)  BAU Concentration -- 7 Grass Mix* 100,000 ([address] [place], [place], [place], [place], Red[place], Sweet Vernal, Timothy)  0.3 ml (Volume)  BAU Concentration -- [place] 10,000  0.2 ml (Volume)  1:20 Concentration -- [place]  0.3 ml (Volume)  1:20 Concentration -- Ragweed Mix  0.5 ml (Volume)  1:20 Concentration -- Weed Mix*  0.5 ml (Volume)  1:20 Concentration -- Eastern 10 Tree Mix (also Sweet Gum)  0.2 ml (Volume)  1:20 Concentration -- Box Elder  0.2 ml (Volume)  1:10 Concentration -- Cedar, red  0.2 ml (Volume)  1:10 Concentration -- Pecan Pollen    2.7  ml Extract Subtotal  2.3  ml Diluent  5.0  ml Maintenance Total   Schedule:  B  Silver Vial (1:1,000,000): Schedule B (6 doses)  Blue Vial (1:100,000): Schedule B (6 doses)  Yellow Vial (1:10,000): Schedule B (6 doses)  Green Vial (1:1,000): Schedule B (6 doses)  Red Vial (1:100): Schedule A (10 doses)   Special Instructions: once per week

## 2021-06-28 NOTE — Progress Notes (Signed)
VIALS MADE. EXP 06-28-22 

## 2021-06-28 NOTE — Progress Notes (Signed)
Aeroallergen Immunotherapy   Ordering Provider: Dr. Wyline Mood   Patient Details  Name: Mary Soto  MRN: [id number]  Date of Birth: [birth year]   Order 2 of 2   Vial Label: M-D-C   0.2 ml (Volume)  1:20 Concentration -- Alternaria alternata  0.5 ml (Volume)  1:10 Concentration -- Cat Hair  0.5 ml (Volume)  1:10 Concentration -- Dog Epithelia    1.2  ml Extract Subtotal  3.8  ml Diluent  5.0  ml Maintenance Total   Schedule:  B  Silver Vial (1:1,000,000): Schedule B (6 doses)  Blue Vial (1:100,000): Schedule B (6 doses)  Yellow Vial (1:10,000): Schedule B (6 doses)  Green Vial (1:1,000): Schedule B (6 doses)  Red Vial (1:100): Schedule A (10 doses)   Special Instructions: once per week

## 2021-06-29 ENCOUNTER — Ambulatory Visit: Payer: BC Managed Care – PPO

## 2021-06-29 DIAGNOSIS — J3081 Allergic rhinitis due to animal (cat) (dog) hair and dander: Secondary | ICD-10-CM | POA: Diagnosis not present

## 2021-07-01 ENCOUNTER — Ambulatory Visit (INDEPENDENT_AMBULATORY_CARE_PROVIDER_SITE_OTHER): Payer: BC Managed Care – PPO

## 2021-07-01 ENCOUNTER — Other Ambulatory Visit: Payer: Self-pay

## 2021-07-01 DIAGNOSIS — J309 Allergic rhinitis, unspecified: Secondary | ICD-10-CM | POA: Diagnosis not present

## 2021-07-01 NOTE — Progress Notes (Signed)
Immunotherapy   Patient Details  Name: Mary Soto MRN: [id number] Date of Birth: May 08, 1996  07/01/2021  Mary Soto started injections for  G-RW-W-T and M-C-D Following schedule: B  Frequency:1 time per week Epi-Pen:Epi-Pen Available  Consent signed and patient instructions given.Patient waited 30 minutes in office without any issues.    Mary Soto 07/01/2021, 2:50 PM

## 2021-07-08 ENCOUNTER — Ambulatory Visit (INDEPENDENT_AMBULATORY_CARE_PROVIDER_SITE_OTHER): Payer: BC Managed Care – PPO | Admitting: *Deleted

## 2021-07-08 DIAGNOSIS — J309 Allergic rhinitis, unspecified: Secondary | ICD-10-CM | POA: Diagnosis not present

## 2021-07-22 ENCOUNTER — Ambulatory Visit (INDEPENDENT_AMBULATORY_CARE_PROVIDER_SITE_OTHER): Payer: BC Managed Care – PPO

## 2021-07-22 DIAGNOSIS — J309 Allergic rhinitis, unspecified: Secondary | ICD-10-CM | POA: Diagnosis not present

## 2021-07-29 ENCOUNTER — Ambulatory Visit (INDEPENDENT_AMBULATORY_CARE_PROVIDER_SITE_OTHER): Payer: BC Managed Care – PPO

## 2021-07-29 DIAGNOSIS — J309 Allergic rhinitis, unspecified: Secondary | ICD-10-CM

## 2021-08-05 ENCOUNTER — Ambulatory Visit (INDEPENDENT_AMBULATORY_CARE_PROVIDER_SITE_OTHER): Payer: BC Managed Care – PPO

## 2021-08-05 DIAGNOSIS — J309 Allergic rhinitis, unspecified: Secondary | ICD-10-CM

## 2021-08-12 ENCOUNTER — Ambulatory Visit (INDEPENDENT_AMBULATORY_CARE_PROVIDER_SITE_OTHER): Payer: BC Managed Care – PPO | Admitting: *Deleted

## 2021-08-12 DIAGNOSIS — J309 Allergic rhinitis, unspecified: Secondary | ICD-10-CM | POA: Diagnosis not present

## 2021-08-19 ENCOUNTER — Ambulatory Visit (INDEPENDENT_AMBULATORY_CARE_PROVIDER_SITE_OTHER): Payer: BC Managed Care – PPO

## 2021-08-19 DIAGNOSIS — J309 Allergic rhinitis, unspecified: Secondary | ICD-10-CM

## 2021-08-26 ENCOUNTER — Ambulatory Visit (INDEPENDENT_AMBULATORY_CARE_PROVIDER_SITE_OTHER): Payer: BC Managed Care – PPO | Admitting: *Deleted

## 2021-08-26 DIAGNOSIS — J309 Allergic rhinitis, unspecified: Secondary | ICD-10-CM | POA: Diagnosis not present

## 2021-09-02 ENCOUNTER — Ambulatory Visit (INDEPENDENT_AMBULATORY_CARE_PROVIDER_SITE_OTHER): Payer: BC Managed Care – PPO

## 2021-09-02 DIAGNOSIS — J309 Allergic rhinitis, unspecified: Secondary | ICD-10-CM

## 2021-09-05 ENCOUNTER — Encounter: Payer: Self-pay | Admitting: Allergy

## 2021-09-05 ENCOUNTER — Ambulatory Visit (INDEPENDENT_AMBULATORY_CARE_PROVIDER_SITE_OTHER): Payer: BC Managed Care – PPO | Admitting: Allergy

## 2021-09-05 ENCOUNTER — Ambulatory Visit: Payer: Self-pay

## 2021-09-05 ENCOUNTER — Other Ambulatory Visit: Payer: Self-pay

## 2021-09-05 ENCOUNTER — Other Ambulatory Visit: Payer: Self-pay | Admitting: Allergy

## 2021-09-05 VITALS — BP 108/62 | HR 103 | Temp 98.2°F | Resp 18 | Ht 65.0 in | Wt 142.2 lb

## 2021-09-05 DIAGNOSIS — J309 Allergic rhinitis, unspecified: Secondary | ICD-10-CM

## 2021-09-05 DIAGNOSIS — J4540 Moderate persistent asthma, uncomplicated: Secondary | ICD-10-CM | POA: Insufficient documentation

## 2021-09-05 DIAGNOSIS — T781XXD Other adverse food reactions, not elsewhere classified, subsequent encounter: Secondary | ICD-10-CM

## 2021-09-05 DIAGNOSIS — J302 Other seasonal allergic rhinitis: Secondary | ICD-10-CM | POA: Diagnosis not present

## 2021-09-05 DIAGNOSIS — H1010 Acute atopic conjunctivitis, unspecified eye: Secondary | ICD-10-CM

## 2021-09-05 MED ORDER — LEVOCETIRIZINE DIHYDROCHLORIDE 5 MG PO TABS: 5.0000 mg | ORAL_TABLET | Freq: Every evening | ORAL | 5 refills | Status: AC

## 2021-09-05 MED ORDER — PROAIR DIGIHALER 108 (90 BASE) MCG/ACT IN AEPB: 2.0000 | INHALATION_SPRAY | RESPIRATORY_TRACT | 2 refills | Status: DC | PRN

## 2021-09-05 NOTE — Assessment & Plan Note (Signed)
Past history - Corn causes itching in the back only and bloating. 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 °Interim history  - no reactions.  °· Continue to avoid corn. °· For mild symptoms you can take over the counter antihistamines such as Benadryl and monitor symptoms closely. If symptoms worsen or if you have severe symptoms including breathing issues, throat closure, significant swelling, whole body hives, severe diarrhea and vomiting, lightheadedness then seek immediate medical care. °

## 2021-09-05 NOTE — Assessment & Plan Note (Signed)
Past history - Perennial rhinoconjunctivitis symptoms for 20+ years with worsening in the spring.  Used to be on allergy immunotherapy (weed-dmite & grass-tree) for 4 to 5 years with some benefit but noticed worsening symptoms since off injections. 2022 bloodwork positive to cat, grass pollen, tree pollen, ragweed pollen, weed pollen. Borderline to dog. Interim history - started AIT on 07/01/2021 (G-RW-W-T and M-C-D)  Continue environmental control measures as below.  Xyzal (levocetirizine) 5mg  in the evening. . Use Flonase (fluticasone) nasal spray 1 spray per nostril twice a day as needed for nasal congestion.  Nasal saline spray (i.e., Simply Saline) or nasal saline lavage (i.e., NeilMed) is recommended as needed and prior to medicated nasal sprays.  May use olopatadine eye drops 0.2% once a day as needed for itchy/watery eyes.  Continue allergy injections - given today.

## 2021-09-05 NOTE — Progress Notes (Signed)
Follow Up Note  RE: Mary Soto MRN: [id number] DOB: 1996-06-12 Date of Office Visit: 09/05/2021  Referring provider: Lewis Moccasin, MD Primary care provider: Lewis Moccasin, MD  Chief Complaint: Follow-up (Patient reports that she has not had any issues with her breathing. She is training for a half marathon. )  History of Present Illness: I had the pleasure of seeing Mary Soto for a follow up visit at the [facility] on 09/05/2021. She is a 25 y.o. female, who is being followed for asthma, allergic rhinoconjunctivitis on AIT and adverse food reaction. Her previous allergy office visit was on 05/30/2021 with Mary Settle FNP. Today is a regular follow up visit.  Asthma Currently on Airduo 1 puff BID and rinsing mouth after each use. Using albuterol prior to exertion with good benefit.  Currently running 4 times per week in the mornings about 3-4 miles each and does a long run during the weekend. Training for half marathon for next month. Used Spiriva with no benefit.  Denies any ER/urgent care visits or prednisone use since the last visit.    Seasonal and perennial allergic rhinoconjunctivitis Started injections in August with some localized reactions. Taking Xyzal 5mg  daily at night but now using Claritin. Using Flonase and eye drops only as needed.  Adverse food reaction Currently avoiding corn.  Assessment and Plan: Ramiya is a 26 y.o. female with: Moderate persistent asthma without complication Past history - Diagnosed with asthma over 15 years ago.  Triggers include allergies, exertion and pet dander.  Patient is an avid runner. 2022 spirometry showed severe obstruction with 67% improvement in FEV1 post bronchodilator treatment.  Interim history - better with Airduo, Spiriva did not help. Training for a half marathon. Today's spirometry was normal. Daily controller medication(s): continue Airduo Digihaler 232 mcg 1 puff twice a day and  rinse mouth after each use. Prior to physical activity: May use albuterol rescue inhaler 2 puffs 5 to 15 minutes prior to strenuous physical activities. Rescue medications: May use albuterol rescue inhaler 2 puffs or nebulizer every 4 to 6 hours as needed for shortness of breath, chest tightness, coughing, and wheezing. Monitor frequency of use.  Get spirometry at next visit.  Seasonal and perennial allergic rhinoconjunctivitis Past history - Perennial rhinoconjunctivitis symptoms for 20+ years with worsening in the spring.  Used to be on allergy immunotherapy (weed-dmite & grass-tree) for 4 to 5 years with some benefit but noticed worsening symptoms since off injections. 2022 bloodwork positive to cat, grass pollen, tree pollen, ragweed pollen, weed pollen.  Borderline to dog. Interim history - started AIT on 07/01/2021 (G-RW-W-T and M-C-D) Continue environmental control measures as below. Xyzal (levocetirizine) 5mg  in the evening. Use Flonase (fluticasone) nasal spray 1 spray per nostril twice a day as needed for nasal congestion. Nasal saline spray (i.e., Simply Saline) or nasal saline lavage (i.e., NeilMed) is recommended as needed and prior to medicated nasal sprays. May use olopatadine eye drops 0.2% once a day as needed for itchy/watery eyes. Continue allergy injections - given today.   Adverse reaction to food, subsequent encounter Past history - Corn causes itching in the back only and bloating. 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 Interim history  - no reactions.  Continue to avoid corn. For mild symptoms you can take over the counter antihistamines such as Benadryl and monitor symptoms closely. If symptoms worsen or if you have severe symptoms including breathing issues, throat closure, significant swelling, whole body hives, severe  diarrhea and vomiting, lightheadedness then seek immediate medical care.  Return in about 4 months (around 01/06/2022).  Meds ordered this encounter   Medications   levocetirizine (XYZAL) 5 MG tablet    Sig: Take 1 tablet (5 mg total) by mouth every evening.    Dispense:  30 tablet    Refill:  5   PROAIR DIGIHALER 108 (90 Base) MCG/ACT AEPB    Sig: Inhale 2 puffs into the lungs every 4 (four) hours as needed (coughing, wheezing, shortness of breath).    Dispense:  1 each    Refill:  2    Please use attached coupon BIN [id number],GRP [id number], ID [id number].    Lab Orders  No laboratory test(s) ordered today    Diagnostics: Spirometry:  Tracings reviewed. Her effort: Good reproducible efforts. FVC: 3.40L FEV1: 2.34L, 76% predicted FEV1/FVC ratio: 69% Interpretation: Spirometry consistent with normal pattern.  Please see scanned spirometry results for details.  Medication List:  Current Outpatient Medications  Medication Sig Dispense Refill   AIRDUO DIGIHALER 232-14 MCG/ACT AEPB Inhale 1 puff into the lungs in the morning and at bedtime. Rinse mouth after each use. 1 each 1   EPINEPHrine (AUVI-Q) 0.3 mg/0.3 mL IJ SOAJ injection Inject 0.3 mg into the muscle as needed for anaphylaxis. 1 each 1   ibuprofen (ADVIL,MOTRIN) 200 MG tablet Take 400 mg by mouth every 6 (six) hours as needed for pain.     Levonorgestrel (SKYLA) 13.5 MG IUD Skyla 14 mcg/24 hrs (3 yrs) 13.5 mg intrauterine device  Take 1 device by intrauterine route.     Olopatadine HCl 0.2 % SOLN Apply 1 drop to eye daily as needed (itchy/watery eyes). 2.5 mL 5   PROAIR DIGIHALER 108 (90 Base) MCG/ACT AEPB Inhale 2 puffs into the lungs every 4 (four) hours as needed (coughing, wheezing, shortness of breath). 1 each 2   venlafaxine (EFFEXOR) 25 MG tablet Take 25 mg by mouth daily.     levocetirizine (XYZAL) 5 MG tablet Take 1 tablet (5 mg total) by mouth every evening. 30 tablet 5   No current facility-administered medications for this visit.   Allergies: Allergies  Allergen Reactions   Corn Oil Itching   Corn-Containing Products Itching   I reviewed her past  medical history, social history, family history, and environmental history and no significant changes have been reported from her previous visit.  Review of Systems  Constitutional:  Negative for appetite change, chills, fever and unexpected weight change.  HENT:  Negative for congestion, rhinorrhea and sneezing.   Eyes:  Negative for discharge and itching.  Respiratory:  Negative for cough, chest tightness, shortness of breath and wheezing.   Cardiovascular:  Negative for chest pain.  Gastrointestinal:  Negative for abdominal pain.  Genitourinary:  Negative for difficulty urinating.  Skin:  Negative for rash.  Allergic/Immunologic: Positive for environmental allergies and food allergies.  Neurological:  Negative for headaches.   Objective: BP 108/62   Pulse (!) 103   Temp 98.2 F (36.8 C) (Temporal)   Resp 18   Ht 5\' 5"  (1.651 m)   Wt 142 lb 3.2 oz (64.5 kg)   SpO2 99%   BMI 23.66 kg/m  Body mass index is 23.66 kg/m. Physical Exam Vitals and nursing note reviewed.  Constitutional:      Appearance: Normal appearance. She is well-developed.  HENT:     Head: Normocephalic and atraumatic.     Right Ear: Tympanic membrane and external ear normal.     Left  Ear: Tympanic membrane and external ear normal.     Nose: Nose normal.     Mouth/Throat:     Mouth: Mucous membranes are moist.     Pharynx: Oropharynx is clear.  Eyes:     Conjunctiva/sclera: Conjunctivae normal.  Cardiovascular:     Rate and Rhythm: Normal rate and regular rhythm.     Heart sounds: Normal heart sounds. No murmur heard.   No friction rub. No gallop.  Pulmonary:     Effort: Pulmonary effort is normal.     Breath sounds: Normal breath sounds. No wheezing, rhonchi or rales.  Musculoskeletal:     Cervical back: Neck supple.  Skin:    General: Skin is warm.     Findings: No rash.  Neurological:     Mental Status: She is alert and oriented to person, place, and time.  Psychiatric:        Behavior:  Behavior normal.   Previous notes and tests were reviewed. The plan was reviewed with the patient/family, and all questions/concerned were addressed.  It was my pleasure to see Panzy today and participate in her care. Please feel free to contact me with any questions or concerns.  Sincerely,  Wyline Mood, DO Allergy & Immunology  [facility] office: [phone] [facility] office: [phone]

## 2021-09-05 NOTE — Patient Instructions (Addendum)
Asthma:  Daily controller medication(s): continue Airduo Digihaler 232 mcg 1 puff twice a day and rinse mouth after each use. Prior to physical activity: May use albuterol rescue inhaler 2 puffs 5 to 15 minutes prior to strenuous physical activities. Rescue medications: May use albuterol rescue inhaler 2 puffs or nebulizer every 4 to 6 hours as needed for shortness of breath, chest tightness, coughing, and wheezing. Monitor frequency of use.  Asthma control goals:  Full participation in all desired activities (may need albuterol before activity) Albuterol use two times or less a week on average (not counting use with activity) Cough interfering with sleep two times or less a month Oral steroids no more than once a year No hospitalizations  Environmental allergies Bloodwork positive to cat, grass pollen, tree pollen, ragweed pollen, weed pollen.  Borderline to dog. Continue environmental control measures as below. Xyzal (levocetirizine) 5mg  in the evening. Use Flonase (fluticasone) nasal spray 1 spray per nostril twice a day as needed for nasal congestion. Nasal saline spray (i.e., Simply Saline) or nasal saline lavage (i.e., NeilMed) is recommended as needed and prior to medicated nasal sprays. May use olopatadine eye drops 0.2% once a day as needed for itchy/watery eyes. Continue allergy injections.   Food allergy Continue to avoid corn.  Follow up in 4 months or sooner if needed.   Reducing Pollen Exposure Pollen seasons: trees (spring), grass (summer) and ragweed/weeds (fall). Keep windows closed in your home and car to lower pollen exposure.  Install air conditioning in the bedroom and throughout the house if possible.  Avoid going out in dry windy days - especially early morning. Pollen counts are highest between 5 - 10 AM and on dry, hot and windy days.  Save outside activities for late afternoon or after a heavy rain, when pollen levels are lower.  Avoid mowing of grass if you  have grass pollen allergy. Be aware that pollen can also be transported indoors on people and pets.  Dry your clothes in an automatic dryer rather than hanging them outside where they might collect pollen.  Rinse hair and eyes before bedtime.

## 2021-09-05 NOTE — Assessment & Plan Note (Addendum)
Past history - Diagnosed with asthma over 15 years ago.  Triggers include allergies, exertion and pet dander.  Patient is an avid runner. 2022 spirometry showed severe obstruction with 67% improvement in FEV1 post bronchodilator treatment.  Interim history - better with Airduo, Spiriva did not help. Training for a half marathon. . Today's spirometry was normal. . Daily controller medication(s): continue Airduo Digihaler 232 mcg 1 puff twice a day and rinse mouth after each use. . Prior to physical activity: May use albuterol rescue inhaler 2 puffs 5 to 15 minutes prior to strenuous physical activities. Marland Kitchen Rescue medications: May use albuterol rescue inhaler 2 puffs or nebulizer every 4 to 6 hours as needed for shortness of breath, chest tightness, coughing, and wheezing. Monitor frequency of use.   Get spirometry at next visit.

## 2021-09-16 ENCOUNTER — Ambulatory Visit (INDEPENDENT_AMBULATORY_CARE_PROVIDER_SITE_OTHER): Payer: BC Managed Care – PPO

## 2021-09-16 DIAGNOSIS — J309 Allergic rhinitis, unspecified: Secondary | ICD-10-CM | POA: Diagnosis not present

## 2021-09-30 ENCOUNTER — Ambulatory Visit (INDEPENDENT_AMBULATORY_CARE_PROVIDER_SITE_OTHER): Payer: BC Managed Care – PPO

## 2021-09-30 DIAGNOSIS — J309 Allergic rhinitis, unspecified: Secondary | ICD-10-CM

## 2021-10-14 ENCOUNTER — Ambulatory Visit (INDEPENDENT_AMBULATORY_CARE_PROVIDER_SITE_OTHER): Payer: BC Managed Care – PPO

## 2021-10-14 DIAGNOSIS — J309 Allergic rhinitis, unspecified: Secondary | ICD-10-CM

## 2021-10-21 ENCOUNTER — Ambulatory Visit (INDEPENDENT_AMBULATORY_CARE_PROVIDER_SITE_OTHER): Payer: BC Managed Care – PPO

## 2021-10-21 DIAGNOSIS — J309 Allergic rhinitis, unspecified: Secondary | ICD-10-CM | POA: Diagnosis not present

## 2021-10-26 ENCOUNTER — Ambulatory Visit (INDEPENDENT_AMBULATORY_CARE_PROVIDER_SITE_OTHER): Payer: BC Managed Care – PPO

## 2021-10-26 DIAGNOSIS — J309 Allergic rhinitis, unspecified: Secondary | ICD-10-CM

## 2021-11-10 ENCOUNTER — Ambulatory Visit (INDEPENDENT_AMBULATORY_CARE_PROVIDER_SITE_OTHER): Payer: BC Managed Care – PPO | Admitting: *Deleted

## 2021-11-10 DIAGNOSIS — J309 Allergic rhinitis, unspecified: Secondary | ICD-10-CM

## 2021-11-25 ENCOUNTER — Ambulatory Visit (INDEPENDENT_AMBULATORY_CARE_PROVIDER_SITE_OTHER): Payer: BC Managed Care – PPO

## 2021-11-25 DIAGNOSIS — J309 Allergic rhinitis, unspecified: Secondary | ICD-10-CM

## 2021-12-02 ENCOUNTER — Ambulatory Visit (INDEPENDENT_AMBULATORY_CARE_PROVIDER_SITE_OTHER): Payer: BC Managed Care – PPO

## 2021-12-02 DIAGNOSIS — J309 Allergic rhinitis, unspecified: Secondary | ICD-10-CM

## 2021-12-08 ENCOUNTER — Ambulatory Visit (INDEPENDENT_AMBULATORY_CARE_PROVIDER_SITE_OTHER): Payer: BC Managed Care – PPO

## 2021-12-08 DIAGNOSIS — J309 Allergic rhinitis, unspecified: Secondary | ICD-10-CM | POA: Diagnosis not present

## 2021-12-15 ENCOUNTER — Ambulatory Visit (INDEPENDENT_AMBULATORY_CARE_PROVIDER_SITE_OTHER): Payer: BC Managed Care – PPO

## 2021-12-15 DIAGNOSIS — J309 Allergic rhinitis, unspecified: Secondary | ICD-10-CM | POA: Diagnosis not present

## 2021-12-29 ENCOUNTER — Ambulatory Visit (INDEPENDENT_AMBULATORY_CARE_PROVIDER_SITE_OTHER): Payer: BC Managed Care – PPO

## 2021-12-29 DIAGNOSIS — J309 Allergic rhinitis, unspecified: Secondary | ICD-10-CM

## 2022-01-05 ENCOUNTER — Ambulatory Visit (INDEPENDENT_AMBULATORY_CARE_PROVIDER_SITE_OTHER): Payer: BC Managed Care – PPO | Admitting: *Deleted

## 2022-01-05 DIAGNOSIS — J309 Allergic rhinitis, unspecified: Secondary | ICD-10-CM | POA: Diagnosis not present

## 2022-01-09 ENCOUNTER — Ambulatory Visit: Payer: Self-pay

## 2022-01-09 ENCOUNTER — Encounter: Payer: Self-pay | Admitting: Allergy

## 2022-01-09 ENCOUNTER — Ambulatory Visit (INDEPENDENT_AMBULATORY_CARE_PROVIDER_SITE_OTHER): Payer: BC Managed Care – PPO | Admitting: Allergy

## 2022-01-09 ENCOUNTER — Other Ambulatory Visit: Payer: Self-pay

## 2022-01-09 VITALS — BP 110/66 | HR 88 | Temp 98.5°F | Resp 18 | Ht 65.0 in | Wt 138.5 lb

## 2022-01-09 DIAGNOSIS — H1010 Acute atopic conjunctivitis, unspecified eye: Secondary | ICD-10-CM

## 2022-01-09 DIAGNOSIS — H1013 Acute atopic conjunctivitis, bilateral: Secondary | ICD-10-CM | POA: Diagnosis not present

## 2022-01-09 DIAGNOSIS — J309 Allergic rhinitis, unspecified: Secondary | ICD-10-CM | POA: Diagnosis not present

## 2022-01-09 DIAGNOSIS — J4540 Moderate persistent asthma, uncomplicated: Secondary | ICD-10-CM | POA: Diagnosis not present

## 2022-01-09 DIAGNOSIS — J302 Other seasonal allergic rhinitis: Secondary | ICD-10-CM

## 2022-01-09 DIAGNOSIS — T781XXD Other adverse food reactions, not elsewhere classified, subsequent encounter: Secondary | ICD-10-CM

## 2022-01-09 MED ORDER — AIRDUO DIGIHALER 232-14 MCG/ACT IN AEPB: 1.0000 | INHALATION_SPRAY | Freq: Two times a day (BID) | RESPIRATORY_TRACT | 5 refills | Status: AC

## 2022-01-09 NOTE — Assessment & Plan Note (Addendum)
Past history - Diagnosed with asthma over 15 years ago.  Triggers include allergies, exertion and pet dander.  Patient is an avid runner. 2022 spirometry showed severe obstruction with 67% improvement in FEV1 post bronchodilator treatment. Spiriva ineffective.  Interim history - not using Airduo daily. Denies any symptoms.   Today's spirometry showed moderate obstruction.   Daily controller medication(s): TAKE EVERYDAY - Airduo Digihaler 232 mcg 1 puff twice a day and rinse mouth after each use.  Prior to physical activity: May use albuterol rescue inhaler 2 puffs 5 to 15 minutes prior to strenuous physical activities.  Rescue medications: May use albuterol rescue inhaler 2 puffs or nebulizer every 4 to 6 hours as needed for shortness of breath, chest tightness, coughing, and wheezing. Monitor frequency of use.   Get spirometry at next visit.

## 2022-01-09 NOTE — Progress Notes (Signed)
Follow Up Note  RE: Mary Soto MRN: [id number] DOB: 1995/11/21 Date of Office Visit: 01/09/2022  Referring provider: Lewis Moccasin, MD Primary care provider: Lewis Moccasin, MD  Chief Complaint: Follow-up and Asthma  History of Present Illness: I had the pleasure of seeing Mary Soto for a follow up visit at the [facility] on 01/09/2022. She is a 26 y.o. female, who is being followed for asthma, allergic rhinoconjunctivitis on AIT and adverse food reaction. Her previous allergy office visit was on 09/05/2021 with Dr. Selena Batten. Today is a regular follow up visit.  Moderate persistent asthma Denies any SOB, coughing, wheezing, chest tightness, nocturnal awakenings, ER/urgent care visits or prednisone use since the last visit.  Currently on Airduo 1 puff twice a day but usually skips a few days per week especially on the days when she is not going to be outdoors.   Patient ran half marathon in November and had to use albuterol during once during the race.   Seasonal and perennial allergic rhinoconjunctivitis Patient is moving to [place] in August for graduate school.  Patient is planning on getting health insurance through the university in [place] and planning staying on the parent's insurance as well until age 5.   Having some localized reactions with the injections but wants to continue in [place]. Currently taking antihistamines in the evening and Flonase as needed. No eye drop use.  Food allergy Avoiding corn with no reactions.   Assessment and Plan: Mary Soto is a 26 y.o. female with: Moderate persistent asthma without complication Past history - Diagnosed with asthma over 15 years ago.  Triggers include allergies, exertion and pet dander.  Patient is an avid runner. 2022 spirometry showed severe obstruction with 67% improvement in FEV1 post bronchodilator treatment. Spiriva ineffective.  Interim history - not using Airduo daily. Denies any symptoms.   Today's spirometry showed moderate obstruction.  Daily controller medication(s): TAKE EVERYDAY - Airduo Digihaler 232 mcg 1 puff twice a day and rinse mouth after each use. Prior to physical activity: May use albuterol rescue inhaler 2 puffs 5 to 15 minutes prior to strenuous physical activities. Rescue medications: May use albuterol rescue inhaler 2 puffs or nebulizer every 4 to 6 hours as needed for shortness of breath, chest tightness, coughing, and wheezing. Monitor frequency of use.  Get spirometry at next visit.  Seasonal and perennial allergic rhinoconjunctivitis Past history - Perennial rhinoconjunctivitis symptoms for 20+ years with worsening in the spring.  Used to be on allergy immunotherapy (weed-dmite & grass-tree) for 4 to 5 years with some benefit but noticed worsening symptoms since off injections. 2022 bloodwork positive to cat, grass pollen, tree pollen, ragweed pollen, weed pollen.  Borderline to dog. Started AIT on 07/01/2021 (G-RW-W-T and M-C-D). Interim history - moving to [place] for graduate school but wants to continue AIT there. Continue environmental control measures as below. Xyzal (levocetirizine) 5mg  in the evening. May take an additional antihistamine on the days of your injections such as Zyrtec (cetirizine), Claritin (loratadine), Allegra (fexofenadine), or Xyzal (levocetirizine).  Use Flonase (fluticasone) nasal spray 1 spray per nostril twice a day as needed for nasal congestion. Nasal saline spray (i.e., Simply Saline) or nasal saline lavage (i.e., NeilMed) is recommended as needed and prior to medicated nasal sprays. May use olopatadine eye drops 0.2% once a day as needed for itchy/watery eyes. Continue allergy injections - given today.  Call the [facility] and see if they are willing to give allergy injections at  the health center. Call the [employer] regarding coverage.  Other adverse food reactions, not elsewhere classified,  subsequent encounter Past history - Corn causes itching in the back only and bloating. 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 Interim history  - no reactions.  Continue to avoid corn. For mild symptoms you can take over the counter antihistamines such as Benadryl and monitor symptoms closely. If symptoms worsen or if you have severe symptoms including breathing issues, throat closure, significant swelling, whole body hives, severe diarrhea and vomiting, lightheadedness then seek immediate medical care.  Return in about 4 months (around 05/09/2022).  Meds ordered this encounter  Medications   Fluticasone-Salmeterol,sensor, (AIRDUO DIGIHALER) 232-14 MCG/ACT AEPB    Sig: Inhale 1 puff into the lungs in the morning and at bedtime. Rinse mouth after each use.    Dispense:  1 each    Refill:  5    [id number]   Lab Orders  No laboratory test(s) ordered today    Diagnostics: Spirometry:  Tracings reviewed. Her effort: Good reproducible efforts. FVC: 3.22L FEV1: 1.53L, 52% predicted FEV1/FVC ratio: 48% Interpretation: Spirometry consistent with moderate obstructive disease.  Please see scanned spirometry results for details.  Medication List:  Current Outpatient Medications  Medication Sig Dispense Refill   EPINEPHrine (AUVI-Q) 0.3 mg/0.3 mL IJ SOAJ injection Inject 0.3 mg into the muscle as needed for anaphylaxis. 1 each 1   ibuprofen (ADVIL,MOTRIN) 200 MG tablet Take 400 mg by mouth every 6 (six) hours as needed for pain.     levocetirizine (XYZAL) 5 MG tablet Take 1 tablet (5 mg total) by mouth every evening. 30 tablet 5   Levonorgestrel (SKYLA) 13.5 MG IUD Skyla 14 mcg/24 hrs (3 yrs) 13.5 mg intrauterine device  Take 1 device by intrauterine route.     Olopatadine HCl 0.2 % SOLN Apply 1 drop to eye daily as needed (itchy/watery eyes). 2.5 mL 5   venlafaxine (EFFEXOR) 37.5 MG tablet Take 37.5 mg by mouth 2 (two) times daily.     Fluticasone-Salmeterol,sensor, (AIRDUO DIGIHALER) 232-14  MCG/ACT AEPB Inhale 1 puff into the lungs in the morning and at bedtime. Rinse mouth after each use. 1 each 5   levalbuterol (XOPENEX HFA) 45 MCG/ACT inhaler Inhale 2 puffs into the lungs 4 (four) times daily. (Patient not taking: Reported on 01/09/2022) 15 g 1   No current facility-administered medications for this visit.   Allergies: Allergies  Allergen Reactions   Corn Oil Itching   Corn-Containing Products Itching   I reviewed her past medical history, social history, family history, and environmental history and no significant changes have been reported from her previous visit.  Review of Systems  Constitutional:  Negative for appetite change, chills, fever and unexpected weight change.  HENT:  Negative for congestion, rhinorrhea and sneezing.   Eyes:  Negative for discharge and itching.  Respiratory:  Negative for cough, chest tightness, shortness of breath and wheezing.   Cardiovascular:  Negative for chest pain.  Gastrointestinal:  Negative for abdominal pain.  Genitourinary:  Negative for difficulty urinating.  Skin:  Negative for rash.  Allergic/Immunologic: Positive for environmental allergies and food allergies.  Neurological:  Negative for headaches.   Objective: BP 110/66    Pulse 88    Temp 98.5 F (36.9 C)    Resp 18    Ht 5\' 5"  (1.651 m)    Wt 138 lb 8 oz (62.8 kg)    SpO2 98%    BMI 23.05 kg/m  Body mass index  is 23.05 kg/m. Physical Exam Vitals and nursing note reviewed.  Constitutional:      Appearance: Normal appearance. She is well-developed.  HENT:     Head: Normocephalic and atraumatic.     Right Ear: Tympanic membrane and external ear normal.     Left Ear: Tympanic membrane and external ear normal.     Nose: Nose normal.     Mouth/Throat:     Mouth: Mucous membranes are moist.     Pharynx: Oropharynx is clear.  Eyes:     Conjunctiva/sclera: Conjunctivae normal.  Cardiovascular:     Rate and Rhythm: Normal rate and regular rhythm.     Heart  sounds: Normal heart sounds. No murmur heard.   No friction rub. No gallop.  Pulmonary:     Effort: Pulmonary effort is normal.     Breath sounds: Normal breath sounds. No wheezing, rhonchi or rales.  Musculoskeletal:     Cervical back: Neck supple.  Skin:    General: Skin is warm.     Findings: No rash.  Neurological:     Mental Status: She is alert and oriented to person, place, and time.  Psychiatric:        Behavior: Behavior normal.  Previous notes and tests were reviewed. The plan was reviewed with the patient/family, and all questions/concerned were addressed.  It was my pleasure to see Mary Soto today and participate in her care. Please feel free to contact me with any questions or concerns.  Sincerely,  Wyline Mood, DO Allergy & Immunology  [facility] office: [phone] [facility] office: [phone]

## 2022-01-09 NOTE — Assessment & Plan Note (Signed)
Past history - Corn causes itching in the back only and bloating. 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 Interim history  - no reactions.   Continue to avoid corn.  For mild symptoms you can take over the counter antihistamines such as Benadryl and monitor symptoms closely. If symptoms worsen or if you have severe symptoms including breathing issues, throat closure, significant swelling, whole body hives, severe diarrhea and vomiting, lightheadedness then seek immediate medical care.

## 2022-01-09 NOTE — Assessment & Plan Note (Signed)
Past history - Perennial rhinoconjunctivitis symptoms for 20+ years with worsening in the spring.  Used to be on allergy immunotherapy (weed-dmite & grass-tree) for 4 to 5 years with some benefit but noticed worsening symptoms since off injections. 2022 bloodwork positive to cat, grass pollen, tree pollen, ragweed pollen, weed pollen. Borderline to dog. Started AIT on 07/01/2021 (G-RW-W-T and M-C-D). Interim history - moving to [place] for graduate school but wants to continue AIT there.  Continue environmental control measures as below.  Xyzal (levocetirizine) 5mg  in the evening.  May take an additional antihistamine on the days of your injections such as Zyrtec (cetirizine), Claritin (loratadine), Allegra (fexofenadine), or Xyzal (levocetirizine).   Use Flonase (fluticasone) nasal spray 1 spray per nostril twice a day as needed for nasal congestion.  Nasal saline spray (i.e., Simply Saline) or nasal saline lavage (i.e., NeilMed) is recommended as needed and prior to medicated nasal sprays.  May use olopatadine eye drops 0.2% once a day as needed for itchy/watery eyes.  Continue allergy injections - given today.   Call the [facility] and see if they are willing to give allergy injections at the health center.  Call the [facility] regarding coverage.

## 2022-01-09 NOTE — Patient Instructions (Addendum)
Asthma:  Daily controller medication(s): TAKE EVERYDAY - Airduo Digihaler 232 mcg 1 puff twice a day and rinse mouth after each use. Prior to physical activity: May use albuterol rescue inhaler 2 puffs 5 to 15 minutes prior to strenuous physical activities. Rescue medications: May use albuterol rescue inhaler 2 puffs or nebulizer every 4 to 6 hours as needed for shortness of breath, chest tightness, coughing, and wheezing. Monitor frequency of use.  Asthma control goals:  Full participation in all desired activities (may need albuterol before activity) Albuterol use two times or less a week on average (not counting use with activity) Cough interfering with sleep two times or less a month Oral steroids no more than once a year No hospitalizations  Environmental allergies Bloodwork positive to cat, grass pollen, tree pollen, ragweed pollen, weed pollen.  Borderline to dog. Continue environmental control measures as below. Xyzal (levocetirizine) 5mg  in the evening. May take an additional antihistamine on the days of your injections such as Zyrtec (cetirizine), Claritin (loratadine), Allegra (fexofenadine), or Xyzal (levocetirizine).  Use Flonase (fluticasone) nasal spray 1 spray per nostril twice a day as needed for nasal congestion. Nasal saline spray (i.e., Simply Saline) or nasal saline lavage (i.e., NeilMed) is recommended as needed and prior to medicated nasal sprays. May use olopatadine eye drops 0.2% once a day as needed for itchy/watery eyes. Continue allergy injections.  Call the [facility] and see if they are willing to give allergy injections at the health center. Call the [facility] regarding coverage.   Food allergy Continue to avoid corn.  Follow up in 4 months or sooner if needed.   Reducing Pollen Exposure Pollen seasons: trees (spring), grass (summer) and ragweed/weeds (fall). Keep windows closed in your home and car to lower pollen exposure.   Install air conditioning in the bedroom and throughout the house if possible.  Avoid going out in dry windy days - especially early morning. Pollen counts are highest between 5 - 10 AM and on dry, hot and windy days.  Save outside activities for late afternoon or after a heavy rain, when pollen levels are lower.  Avoid mowing of grass if you have grass pollen allergy. Be aware that pollen can also be transported indoors on people and pets.  Dry your clothes in an automatic dryer rather than hanging them outside where they might collect pollen.  Rinse hair and eyes before bedtime.

## 2022-01-20 ENCOUNTER — Ambulatory Visit (INDEPENDENT_AMBULATORY_CARE_PROVIDER_SITE_OTHER): Payer: BC Managed Care – PPO

## 2022-01-20 DIAGNOSIS — J309 Allergic rhinitis, unspecified: Secondary | ICD-10-CM | POA: Diagnosis not present

## 2022-01-23 ENCOUNTER — Ambulatory Visit (INDEPENDENT_AMBULATORY_CARE_PROVIDER_SITE_OTHER): Payer: BC Managed Care – PPO

## 2022-01-23 DIAGNOSIS — J309 Allergic rhinitis, unspecified: Secondary | ICD-10-CM

## 2022-02-03 ENCOUNTER — Telehealth: Payer: Self-pay

## 2022-02-03 ENCOUNTER — Ambulatory Visit (INDEPENDENT_AMBULATORY_CARE_PROVIDER_SITE_OTHER): Payer: BC Managed Care – PPO

## 2022-02-03 DIAGNOSIS — J309 Allergic rhinitis, unspecified: Secondary | ICD-10-CM | POA: Diagnosis not present

## 2022-02-03 NOTE — Telephone Encounter (Signed)
I noted change in her flowsheet. Thank you  ?

## 2022-02-03 NOTE — Telephone Encounter (Signed)
Patient has been having large reactions with her allergy shots. Last week her reaction was +4 so we switched her to schedule A. She stated that +4 reactions were common with each injection. Today she came in for an injection and had a +2 reaction from her lower dose injection she received last week. Please advise on if she needs epi wash or lowering the dose.  ?

## 2022-02-03 NOTE — Telephone Encounter (Signed)
Please add on epi wash.  ?Make sure she is taking an additional antihistamine the day before and the day of injections. ? ?Thank you. ?

## 2022-02-09 ENCOUNTER — Ambulatory Visit (INDEPENDENT_AMBULATORY_CARE_PROVIDER_SITE_OTHER): Payer: BC Managed Care – PPO

## 2022-02-09 DIAGNOSIS — J309 Allergic rhinitis, unspecified: Secondary | ICD-10-CM

## 2022-02-16 ENCOUNTER — Ambulatory Visit (INDEPENDENT_AMBULATORY_CARE_PROVIDER_SITE_OTHER): Payer: BC Managed Care – PPO | Admitting: *Deleted

## 2022-02-16 DIAGNOSIS — J309 Allergic rhinitis, unspecified: Secondary | ICD-10-CM

## 2022-03-02 ENCOUNTER — Ambulatory Visit (INDEPENDENT_AMBULATORY_CARE_PROVIDER_SITE_OTHER): Payer: BC Managed Care – PPO

## 2022-03-02 DIAGNOSIS — J309 Allergic rhinitis, unspecified: Secondary | ICD-10-CM

## 2022-03-10 ENCOUNTER — Ambulatory Visit (INDEPENDENT_AMBULATORY_CARE_PROVIDER_SITE_OTHER): Payer: BC Managed Care – PPO | Admitting: *Deleted

## 2022-03-10 DIAGNOSIS — J309 Allergic rhinitis, unspecified: Secondary | ICD-10-CM

## 2022-03-16 ENCOUNTER — Ambulatory Visit (INDEPENDENT_AMBULATORY_CARE_PROVIDER_SITE_OTHER): Payer: BC Managed Care – PPO

## 2022-03-16 DIAGNOSIS — J309 Allergic rhinitis, unspecified: Secondary | ICD-10-CM

## 2022-03-23 ENCOUNTER — Ambulatory Visit (INDEPENDENT_AMBULATORY_CARE_PROVIDER_SITE_OTHER): Payer: BC Managed Care – PPO

## 2022-03-23 DIAGNOSIS — J309 Allergic rhinitis, unspecified: Secondary | ICD-10-CM | POA: Diagnosis not present

## 2022-04-04 ENCOUNTER — Ambulatory Visit (INDEPENDENT_AMBULATORY_CARE_PROVIDER_SITE_OTHER): Payer: BC Managed Care – PPO

## 2022-04-04 DIAGNOSIS — J309 Allergic rhinitis, unspecified: Secondary | ICD-10-CM

## 2022-04-13 ENCOUNTER — Ambulatory Visit (INDEPENDENT_AMBULATORY_CARE_PROVIDER_SITE_OTHER): Payer: BC Managed Care – PPO

## 2022-04-13 DIAGNOSIS — J309 Allergic rhinitis, unspecified: Secondary | ICD-10-CM | POA: Diagnosis not present

## 2022-05-05 ENCOUNTER — Ambulatory Visit (INDEPENDENT_AMBULATORY_CARE_PROVIDER_SITE_OTHER): Payer: BC Managed Care – PPO

## 2022-05-05 DIAGNOSIS — J309 Allergic rhinitis, unspecified: Secondary | ICD-10-CM | POA: Diagnosis not present

## 2022-05-10 ENCOUNTER — Ambulatory Visit: Payer: BC Managed Care – PPO | Admitting: Allergy

## 2022-05-12 ENCOUNTER — Ambulatory Visit (INDEPENDENT_AMBULATORY_CARE_PROVIDER_SITE_OTHER): Payer: BC Managed Care – PPO | Admitting: *Deleted

## 2022-05-12 DIAGNOSIS — J309 Allergic rhinitis, unspecified: Secondary | ICD-10-CM

## 2022-05-18 ENCOUNTER — Ambulatory Visit (INDEPENDENT_AMBULATORY_CARE_PROVIDER_SITE_OTHER): Payer: BC Managed Care – PPO

## 2022-05-18 DIAGNOSIS — J309 Allergic rhinitis, unspecified: Secondary | ICD-10-CM

## 2022-05-23 ENCOUNTER — Ambulatory Visit (INDEPENDENT_AMBULATORY_CARE_PROVIDER_SITE_OTHER): Payer: BC Managed Care – PPO

## 2022-05-23 DIAGNOSIS — J309 Allergic rhinitis, unspecified: Secondary | ICD-10-CM

## 2022-05-25 ENCOUNTER — Telehealth: Payer: Self-pay

## 2022-05-25 ENCOUNTER — Ambulatory Visit: Payer: Self-pay

## 2022-05-25 NOTE — Telephone Encounter (Addendum)
Patient is going to school in the fall out of the country. Patient is wanting to take her vials with her. Patient has been informed to speak with the school regarding allergy injection administration while in school. Patient verbalized understanding. She informed me that she will be flying with her vials, if the school agrees to administer them. She is requesting a letter to travel with her vials. Patient will let [place] know what the school says and then proceed with faxing the acknowledgement forms. Patient is on her Green vials that expire on 06/28/2022. I will wait to see if patient will be able to continue immunotherapy at school prior to ordering her vials.

## 2022-06-01 ENCOUNTER — Ambulatory Visit (INDEPENDENT_AMBULATORY_CARE_PROVIDER_SITE_OTHER): Payer: BC Managed Care – PPO | Admitting: *Deleted

## 2022-06-01 DIAGNOSIS — J309 Allergic rhinitis, unspecified: Secondary | ICD-10-CM | POA: Diagnosis not present

## 2022-06-08 ENCOUNTER — Ambulatory Visit (INDEPENDENT_AMBULATORY_CARE_PROVIDER_SITE_OTHER): Payer: BC Managed Care – PPO

## 2022-06-08 DIAGNOSIS — J309 Allergic rhinitis, unspecified: Secondary | ICD-10-CM

## 2022-06-15 NOTE — Telephone Encounter (Addendum)
Left a message for patient to call the office regarding taking her allergy vials out of country for school.

## 2022-06-16 ENCOUNTER — Ambulatory Visit (INDEPENDENT_AMBULATORY_CARE_PROVIDER_SITE_OTHER): Payer: BC Managed Care – PPO

## 2022-06-16 DIAGNOSIS — J309 Allergic rhinitis, unspecified: Secondary | ICD-10-CM | POA: Diagnosis not present

## 2022-06-16 NOTE — Telephone Encounter (Signed)
Patient came in to get her allergy injections, she informed me that she will have to go see the provider at the college she will be attending before she is able to continue. I informed patient that she could come the next two weeks however informed patient that vials expire on 06/28/2022 and she would need to come in prior to then. I informed patient that we will hold off on ordering more vials until she has see the provider at her school. Patient verbalized understanding and was appreciative for hold off on ordering until she is certain they will administer them. Patient will call once she has established care with the provider at her school.

## 2022-06-29 NOTE — Telephone Encounter (Signed)
Patient came in today to get allergy injections however they expired 06/28/2022 and was not given. Patient was given acknowledgement forms to have signed once she establishes care in [facility]. Patient asked if the other place would be able to make her vials so she isn't having to pay too much for shipment. I informed patient that is it possible however they would need to consent to that. I informed patient that if they do consent to making them she would need to sign a medical release form and then we would send her records along with her immunotherapy script. Patient verbalized understanding and would reach out to the office once she establishes care.
